# Patient Record
Sex: Female | Born: 1950 | Race: White | Hispanic: No | Marital: Single | Smoking: Former smoker
Health system: Southern US, Community
[De-identification: ages and names within clinical notes are randomized; demographics above are authoritative.]

## PROBLEM LIST (undated history)

## (undated) DIAGNOSIS — I499 Cardiac arrhythmia, unspecified: Secondary | ICD-10-CM

## (undated) DIAGNOSIS — T4145XA Adverse effect of unspecified anesthetic, initial encounter: Secondary | ICD-10-CM

## (undated) DIAGNOSIS — R112 Nausea with vomiting, unspecified: Secondary | ICD-10-CM

## (undated) DIAGNOSIS — E559 Vitamin D deficiency, unspecified: Secondary | ICD-10-CM

## (undated) DIAGNOSIS — R7303 Prediabetes: Secondary | ICD-10-CM

## (undated) DIAGNOSIS — J189 Pneumonia, unspecified organism: Secondary | ICD-10-CM

## (undated) DIAGNOSIS — M1710 Unilateral primary osteoarthritis, unspecified knee: Secondary | ICD-10-CM

## (undated) DIAGNOSIS — E785 Hyperlipidemia, unspecified: Secondary | ICD-10-CM

## (undated) DIAGNOSIS — T8859XA Other complications of anesthesia, initial encounter: Secondary | ICD-10-CM

## (undated) DIAGNOSIS — J309 Allergic rhinitis, unspecified: Secondary | ICD-10-CM

## (undated) DIAGNOSIS — R011 Cardiac murmur, unspecified: Secondary | ICD-10-CM

## (undated) DIAGNOSIS — M179 Osteoarthritis of knee, unspecified: Secondary | ICD-10-CM

## (undated) DIAGNOSIS — Z862 Personal history of diseases of the blood and blood-forming organs and certain disorders involving the immune mechanism: Secondary | ICD-10-CM

## (undated) DIAGNOSIS — G9050 Complex regional pain syndrome I, unspecified: Secondary | ICD-10-CM

## (undated) DIAGNOSIS — M1990 Unspecified osteoarthritis, unspecified site: Secondary | ICD-10-CM

## (undated) DIAGNOSIS — Z9889 Other specified postprocedural states: Secondary | ICD-10-CM

## (undated) HISTORY — PX: BILATERAL OOPHORECTOMY: SHX1221

## (undated) HISTORY — PX: WRIST SURGERY: SHX841

---
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
## 1987-06-11 HISTORY — PX: MANDIBLE RECONSTRUCTION: SHX431

## 1992-06-10 HISTORY — PX: ABDOMINAL HYSTERECTOMY: SHX81

## 1996-06-10 DIAGNOSIS — J189 Pneumonia, unspecified organism: Secondary | ICD-10-CM

## 1996-06-10 HISTORY — DX: Pneumonia, unspecified organism: J18.9

## 2014-02-23 ENCOUNTER — Other Ambulatory Visit: Payer: Self-pay

## 2014-02-23 DIAGNOSIS — Z1231 Encounter for screening mammogram for malignant neoplasm of breast: Secondary | ICD-10-CM

## 2014-03-08 ENCOUNTER — Encounter (INDEPENDENT_AMBULATORY_CARE_PROVIDER_SITE_OTHER): Payer: Self-pay

## 2014-03-08 ENCOUNTER — Ambulatory Visit
Admission: RE | Admit: 2014-03-08 | Discharge: 2014-03-08 | Disposition: A | Discharge status: Home / Self Care | Payer: TRICARE For Life (TFL) | Source: Ambulatory Visit

## 2014-03-08 DIAGNOSIS — Z1231 Encounter for screening mammogram for malignant neoplasm of breast: Secondary | ICD-10-CM

## 2015-08-18 ENCOUNTER — Emergency Department (HOSPITAL_COMMUNITY)
Admission: EM | Admit: 2015-08-18 | Discharge: 2015-08-18 | Disposition: A | Discharge status: Home / Self Care | Payer: TRICARE For Life (TFL) | Attending: Emergency Medicine | Admitting: Emergency Medicine

## 2015-08-18 ENCOUNTER — Encounter (HOSPITAL_COMMUNITY): Payer: Self-pay | Admitting: Emergency Medicine

## 2015-08-18 ENCOUNTER — Emergency Department (HOSPITAL_COMMUNITY): Payer: TRICARE For Life (TFL)

## 2015-08-18 DIAGNOSIS — S29001A Unspecified injury of muscle and tendon of front wall of thorax, initial encounter: Secondary | ICD-10-CM | POA: Diagnosis not present

## 2015-08-18 DIAGNOSIS — X58XXXA Exposure to other specified factors, initial encounter: Secondary | ICD-10-CM | POA: Diagnosis not present

## 2015-08-18 DIAGNOSIS — T148 Other injury of unspecified body region: Secondary | ICD-10-CM | POA: Diagnosis not present

## 2015-08-18 DIAGNOSIS — Y9389 Activity, other specified: Secondary | ICD-10-CM | POA: Insufficient documentation

## 2015-08-18 DIAGNOSIS — T148XXA Other injury of unspecified body region, initial encounter: Secondary | ICD-10-CM

## 2015-08-18 DIAGNOSIS — Y998 Other external cause status: Secondary | ICD-10-CM | POA: Diagnosis not present

## 2015-08-18 DIAGNOSIS — Y9289 Other specified places as the place of occurrence of the external cause: Secondary | ICD-10-CM | POA: Insufficient documentation

## 2015-08-18 DIAGNOSIS — S46811A Strain of other muscles, fascia and tendons at shoulder and upper arm level, right arm, initial encounter: Secondary | ICD-10-CM | POA: Diagnosis not present

## 2015-08-18 DIAGNOSIS — S4991XA Unspecified injury of right shoulder and upper arm, initial encounter: Secondary | ICD-10-CM | POA: Diagnosis present

## 2015-08-18 MED ORDER — METHOCARBAMOL 500 MG PO TABS: 500.0000 mg | ORAL_TABLET | Freq: Two times a day (BID) | ORAL | Status: DC

## 2015-08-18 MED ORDER — DICLOFENAC SODIUM ER 100 MG PO TB24: 100.0000 mg | ORAL_TABLET | Freq: Every day | ORAL | Status: DC

## 2015-08-18 MED ORDER — METHOCARBAMOL 500 MG PO TABS
1000.0000 mg | ORAL_TABLET | Freq: Once | ORAL | Status: AC
  Administered 2015-08-18: 1000 mg via ORAL
  Filled 2015-08-18: qty 2

## 2015-08-18 MED ORDER — KETOROLAC TROMETHAMINE 30 MG/ML IJ SOLN
30.0000 mg | Freq: Once | INTRAMUSCULAR | Status: AC
  Administered 2015-08-18: 30 mg via INTRAVENOUS
  Filled 2015-08-18: qty 1

## 2015-08-18 NOTE — ED Notes (Signed)
Pt comes to Ed via ems, right clavicle injury, hx of spinal injuries, went to get it checked out. Decrease range of motion. Pt does not know what happened. bp 130/80, hr 78, sp02 98, rr 20. 20 in left hand/ fentanyl 100 mic given. Allergy to codeine/ morphine. Pain score 7 out 10.

## 2015-08-18 NOTE — ED Provider Notes (Signed)
CSN: [id number]     Arrival date & time 08/18/15  [id number] History   First MD Initiated Contact with Patient 08/18/15 [id number]     Chief Complaint  Patient presents with  . Clavicle Injury     (Consider location/radiation/quality/duration/timing/severity/associated sxs/prior Treatment) Patient is a 65 y.o. female presenting with shoulder injury. The history is provided by the patient.  Shoulder Injury This is a new problem. The current episode started more than 2 days ago. The problem occurs constantly. The problem has been gradually worsening. Pertinent negatives include no chest pain, no headaches and no shortness of breath. Nothing aggravates the symptoms. The symptoms are relieved by position. Treatments tried: muscle ball. The treatment provided no relief.  Thinks her clavicle is protruding following a chiropractic manipulation on Tuesday  History reviewed. No pertinent past medical history. History reviewed. No pertinent past surgical history. History reviewed. No pertinent family history. Social History  Substance Use Topics  . Smoking status: Unknown If Ever Smoked  . Smokeless tobacco: None  . Alcohol Use: None   OB History    No data available     Review of Systems  Respiratory: Negative for shortness of breath.   Cardiovascular: Negative for chest pain.  Neurological: Negative for weakness, numbness and headaches.  All other systems reviewed and are negative.     Allergies  Review of patient's allergies indicates not on file.  Home Medications   Prior to Admission medications   Not on File   There were no vitals taken for this visit. Physical Exam  Constitutional: She is oriented to person, place, and time. She appears well-developed and well-nourished. No distress.  HENT:  Head: Normocephalic and atraumatic.  Mouth/Throat: Oropharynx is clear and moist.  Eyes: Conjunctivae are normal. Pupils are equal, round, and reactive to light.  Neck: Normal range of  motion. Neck supple.  Cardiovascular: Normal rate, regular rhythm and intact distal pulses.   Pulmonary/Chest: Effort normal and breath sounds normal. No respiratory distress. She has no wheezes. She has no rales. She exhibits tenderness.  Abdominal: Soft. Bowel sounds are normal. There is no tenderness. There is no rebound and no guarding.  Musculoskeletal: Normal range of motion.  Neurological: She is alert and oriented to person, place, and time. She has normal reflexes.  Skin: Skin is warm and dry.  Psychiatric: She has a normal mood and affect.    ED Course  Procedures (including critical care time) Labs Review Labs Reviewed - No data to display  Imaging Review No results found. I have personally reviewed and evaluated these images and lab results as part of my medical decision-making.   EKG Interpretation None      MDM   Final diagnoses:  None    Muscle strain: NSAIDS, heat, and muscle relaxants.  Follow up with your PMD.  No more chiropractic manipulation     Aleighya Mcanelly, MD 08/18/15 [phone]

## 2018-12-18 ENCOUNTER — Other Ambulatory Visit: Payer: Self-pay | Admitting: Orthopaedic Surgery

## 2019-01-08 ENCOUNTER — Other Ambulatory Visit: Payer: Self-pay | Admitting: Orthopaedic Surgery

## 2019-01-08 NOTE — Care Plan (Signed)
Spoke with patient prior to surgery. Will discharge to home with family and HHPT. She has all equipment except a rolling walker and this has been ordered. Referral to [facility] at home. She will transition to [facility] after MD follow up.  Patient and MD agreeable to plan.  Choice offered.    Ladell Heads, [place]

## 2019-01-14 NOTE — Patient Instructions (Signed)
YOU NEED TO HAVE A COVID 19 TEST ON__8/7_____ @_3 :20______, THIS TEST MUST BE DONE BEFORE SURGERY, COME  [address], [place] , [id number]. ONCE YOUR COVID TEST IS COMPLETED, PLEASE BEGIN THE QUARANTINE INSTRUCTIONS AS OUTLINED IN YOUR HANDOUT.                Erica Stanley    Your procedure is scheduled on: Tuesday 01/19/19   Report to [facility] Main  Entrance Report to Short Stay at 5:30 AM   1 VISITOR IS ALLOWED TO WAIT IN WAITING ROOM  ONLY DAY OF YOUR SURGERY.  No family in Short Stay at this time   Call this number if you have problems the morning of surgery [phone]    BRUSH YOUR TEETH MORNING OF SURGERY AND RINSE YOUR MOUTH OUT, NO CHEWING GUM CANDY OR MINTS.   Do not eat food After Midnight.  YOU MAY HAVE CLEAR LIQUIDS FROM MIDNIGHT UNTIL 4:00 AM . At 4:00 AM Please finish the prescribed Pre-Surgery  drink. Nothing by mouth after you finish the  drink !   Take these medicines the morning of surgery with A SIP OF WATER:none                                  You may not have any metal on your body including hair pins and              piercings              Do not wear jewelry, make-up, lotions, powders or perfumes, deodorant             Do not wear nail polish.  Do not shave  48 hours prior to surgery.          Do not bring valuables to the hospital. [facility] IS NOT             RESPONSIBLE   FOR VALUABLES.  Contacts, dentures or bridgework may not be worn into surgery.    Patients discharged the day of surgery will not be allowed to drive home.  IF YOU ARE HAVING SURGERY AND GOING HOME THE SAME DAY, YOU MUST HAVE AN ADULT TO DRIVE YOU HOME AND BE WITH YOU FOR 24 HOURS.  YOU MAY GO HOME BY TAXI OR UBER OR ORTHERWISE, BUT AN ADULT MUST ACCOMPANY YOU HOME AND STAY WITH YOU FOR 24 HOURS.  Name and phone number of your driver:  Special Instructions: N/A           _____________________________________________________________________              [facility] - Preparing for [facility]  Before surgery, you can play an important role.   Because skin is not sterile, your skin needs to be as free of germs as possible.   You can reduce the number of germs on your skin by washing with CHG (chlorahexidine gluconate) soap before surgery.   CHG is an antiseptic cleaner which kills germs and bonds with the skin to continue killing germs even after washing. Please DO NOT use if you have an allergy to CHG or antibacterial soaps.   If your skin becomes reddened/irritated stop using the CHG and inform your nurse when you arrive at Short Stay. Do not shave (including legs and underarms) for at least 48 hours prior to the first CHG shower.   Please follow these instructions carefully:  1.  Shower with CHG Soap the night before surgery and the  morning of Surgery.  2.  If you choose to wash your hair, wash your hair first as usual with your  normal  shampoo.  3.  After you shampoo, rinse your hair and body thoroughly to remove the  shampoo.                                        4.  Use CHG as you would any other liquid soap.  You can apply chg directly  to the skin and wash                       Gently with a scrungie or clean washcloth.  5.  Apply the CHG Soap to your body ONLY FROM THE NECK DOWN.   Do not use on face/ open                           Wound or open sores. Avoid contact with eyes, ears mouth and genitals (private parts).                       Wash face,  Genitals (private parts) with your normal soap.             6.  Wash thoroughly, paying special attention to the area where your surgery  will be performed.  7.  Thoroughly rinse your body with warm water from the neck down.  8.  DO NOT shower/wash with your normal soap after using and rinsing off  the CHG Soap.             9.  Pat yourself dry with a clean towel.            10.  Wear clean pajamas.            11.  Place clean sheets on your bed the night of your first shower and do not   sleep with pets.  Day of Surgery : Do not apply any lotions/deodorants the morning of surgery.  Please wear clean clothes to the hospital/surgery center.   FAILURE TO FOLLOW THESE INSTRUCTIONS MAY RESULT IN THE CANCELLATION OF YOUR SURGERY PATIENT SIGNATURE_________________________________  NURSE SIGNATURE__________________________________  ________________________________________________________________________   Rogelia MireIncentive Spirometer  An incentive spirometer is a tool that can help keep your lungs clear and active. This tool measures how well you are filling your lungs with each breath. Taking long deep breaths may help reverse or decrease the chance of developing breathing (pulmonary) problems (especially infection) following:  A long period of time when you are unable to move or be active. BEFORE THE PROCEDURE   If the spirometer includes an indicator to show your best effort, your nurse or respiratory therapist will set it to a desired goal.  If possible, sit up straight or lean slightly forward. Try not to slouch.  Hold the incentive spirometer in an upright position. INSTRUCTIONS FOR USE  1. Sit on the edge of your bed if possible, or sit up as far as you can in bed or on a chair. 2. Hold the incentive spirometer in an upright position. 3. Breathe out normally. 4. Place the mouthpiece in your mouth and seal your lips tightly around it. 5. Breathe in slowly and as deeply as possible,  raising the piston or the ball toward the top of the column. 6. Hold your breath for 3-5 seconds or for as long as possible. Allow the piston or ball to fall to the bottom of the column. 7. Remove the mouthpiece from your mouth and breathe out normally. 8. Rest for a few seconds and repeat Steps 1 through 7 at least 10 times every 1-2 hours when you are awake. Take your time and take a few normal breaths between deep breaths. 9. The spirometer may include an indicator to show your best effort. Use  the indicator as a goal to work toward during each repetition. 10. After each set of 10 deep breaths, practice coughing to be sure your lungs are clear. If you have an incision (the cut made at the time of surgery), support your incision when coughing by placing a pillow or rolled up towels firmly against it. Once you are able to get out of bed, walk around indoors and cough well. You may stop using the incentive spirometer when instructed by your caregiver.  RISKS AND COMPLICATIONS  Take your time so you do not get dizzy or light-headed.  If you are in pain, you may need to take or ask for pain medication before doing incentive spirometry. It is harder to take a deep breath if you are having pain. AFTER USE  Rest and breathe slowly and easily.  It can be helpful to keep track of a log of your progress. Your caregiver can provide you with a simple table to help with this. If you are using the spirometer at home, follow these instructions: [place] IF:   You are having difficultly using the spirometer.  You have trouble using the spirometer as often as instructed.  Your pain medication is not giving enough relief while using the spirometer.  You develop fever of 100.5 F (38.1 C) or higher. SEEK IMMEDIATE MEDICAL CARE IF:   You cough up bloody sputum that had not been present before.  You develop fever of 102 F (38.9 C) or greater.  You develop worsening pain at or near the incision site. MAKE SURE YOU:   Understand these instructions.  Will watch your condition.  Will get help right away if you are not doing well or get worse. Document Released: 10/07/2006 Document Revised: 08/19/2011 Document Reviewed: 12/08/2006 [facility] Patient Information 2014 [place], [place].   ________________________________________________________________________

## 2019-01-15 ENCOUNTER — Ambulatory Visit (HOSPITAL_COMMUNITY)
Admission: RE | Admit: 2019-01-15 | Discharge: 2019-01-15 | Disposition: A | Discharge status: Home / Self Care | Payer: Medicare Other | Source: Ambulatory Visit | Attending: Orthopaedic Surgery | Admitting: Orthopaedic Surgery

## 2019-01-15 ENCOUNTER — Other Ambulatory Visit: Payer: Self-pay

## 2019-01-15 ENCOUNTER — Other Ambulatory Visit (HOSPITAL_COMMUNITY)
Admission: RE | Admit: 2019-01-15 | Discharge: 2019-01-15 | Disposition: A | Discharge status: Home / Self Care | Payer: Medicare Other | Source: Ambulatory Visit

## 2019-01-15 ENCOUNTER — Encounter (INDEPENDENT_AMBULATORY_CARE_PROVIDER_SITE_OTHER): Payer: Self-pay

## 2019-01-15 ENCOUNTER — Encounter (HOSPITAL_COMMUNITY)
Admission: RE | Admit: 2019-01-15 | Discharge: 2019-01-15 | Disposition: A | Discharge status: Home / Self Care | Payer: Medicare Other | Source: Ambulatory Visit | Attending: Orthopaedic Surgery | Admitting: Orthopaedic Surgery

## 2019-01-15 ENCOUNTER — Encounter (HOSPITAL_COMMUNITY): Payer: Self-pay

## 2019-01-15 DIAGNOSIS — Z20828 Contact with and (suspected) exposure to other viral communicable diseases: Secondary | ICD-10-CM | POA: Diagnosis not present

## 2019-01-15 DIAGNOSIS — M1712 Unilateral primary osteoarthritis, left knee: Secondary | ICD-10-CM | POA: Diagnosis not present

## 2019-01-15 DIAGNOSIS — Z01818 Encounter for other preprocedural examination: Secondary | ICD-10-CM

## 2019-01-15 DIAGNOSIS — R9431 Abnormal electrocardiogram [ECG] [EKG]: Secondary | ICD-10-CM | POA: Diagnosis not present

## 2019-01-15 DIAGNOSIS — I447 Left bundle-branch block, unspecified: Secondary | ICD-10-CM | POA: Diagnosis not present

## 2019-01-15 HISTORY — DX: Cardiac arrhythmia, unspecified: I49.9

## 2019-01-15 HISTORY — DX: Other complications of anesthesia, initial encounter: T88.59XA

## 2019-01-15 HISTORY — DX: Nausea with vomiting, unspecified: R11.2

## 2019-01-15 HISTORY — DX: Other specified postprocedural states: Z98.890

## 2019-01-15 LAB — CBC WITH DIFFERENTIAL/PLATELET
Abs Immature Granulocytes: 0.03 10*3/uL (ref 0.00–0.07)
Basophils Absolute: 0.1 10*3/uL (ref 0.0–0.1)
Basophils Relative: 1 %
Eosinophils Absolute: 0.3 10*3/uL (ref 0.0–0.5)
Eosinophils Relative: 5 %
HCT: 40.5 % (ref 36.0–46.0)
Hemoglobin: 12.9 g/dL (ref 12.0–15.0)
Immature Granulocytes: 1 %
Lymphocytes Relative: 27 %
Lymphs Abs: 1.7 10*3/uL (ref 0.7–4.0)
MCH: 29.9 pg (ref 26.0–34.0)
MCHC: 31.9 g/dL (ref 30.0–36.0)
MCV: 93.8 fL (ref 80.0–100.0)
Monocytes Absolute: 0.5 10*3/uL (ref 0.1–1.0)
Monocytes Relative: 8 %
Neutro Abs: 3.7 10*3/uL (ref 1.7–7.7)
Neutrophils Relative %: 58 %
Platelets: 287 10*3/uL (ref 150–400)
RBC: 4.32 MIL/uL (ref 3.87–5.11)
RDW: 13.3 % (ref 11.5–15.5)
WBC: 6.2 10*3/uL (ref 4.0–10.5)
nRBC: 0 % (ref 0.0–0.2)

## 2019-01-15 LAB — BASIC METABOLIC PANEL
Anion gap: 9 (ref 5–15)
BUN: 19 mg/dL (ref 8–23)
CO2: 29 mmol/L (ref 22–32)
Calcium: 9.7 mg/dL (ref 8.9–10.3)
Chloride: 102 mmol/L (ref 98–111)
Creatinine, Ser: 0.72 mg/dL (ref 0.44–1.00)
GFR calc Af Amer: >60 mL/min (ref >60)
GFR calc non Af Amer: >60 mL/min (ref >60)
Glucose, Bld: 144 mg/dL — ABNORMAL HIGH (ref 70–99)
Potassium: 3.8 mmol/L (ref 3.5–5.1)
Sodium: 140 mmol/L (ref 135–145)

## 2019-01-15 LAB — URINALYSIS, ROUTINE W REFLEX MICROSCOPIC
Bilirubin Urine: NEGATIVE
Glucose, UA: NEGATIVE mg/dL
Hgb urine dipstick: NEGATIVE
Ketones, ur: NEGATIVE mg/dL
Leukocytes,Ua: NEGATIVE
Nitrite: NEGATIVE
Protein, ur: NEGATIVE mg/dL
Specific Gravity, Urine: 1.006 (ref 1.005–1.030)
pH: 6 (ref 5.0–8.0)

## 2019-01-15 LAB — APTT: aPTT: 31 seconds (ref 24–36)

## 2019-01-15 LAB — SURGICAL PCR SCREEN
MRSA, PCR: NEGATIVE
Staphylococcus aureus: POSITIVE — AB

## 2019-01-15 LAB — PROTIME-INR
INR: 1 (ref 0.8–1.2)
Prothrombin Time: 13 seconds (ref 11.4–15.2)

## 2019-01-16 LAB — ABO/RH: ABO/RH(D): A POS

## 2019-01-16 LAB — SARS CORONAVIRUS 2 (TAT 6-24 HRS): SARS Coronavirus 2: NEGATIVE

## 2019-01-18 MED ORDER — BUPIVACAINE LIPOSOME 1.3 % IJ SUSP
20.0000 mL | Freq: Once | INTRAMUSCULAR | Status: DC
  Filled 2019-01-18: qty 20

## 2019-01-18 MED ORDER — TRANEXAMIC ACID 1000 MG/10ML IV SOLN
2000.0000 mg | INTRAVENOUS | Status: DC
  Filled 2019-01-18: qty 20

## 2019-01-18 NOTE — H&P (Signed)
TOTAL KNEE ADMISSION H&P  Patient is being admitted for left total knee arthroplasty.  Subjective:  Chief Complaint:left knee pain.  HPI: TEPPCO PartnersCandice Stanley, 68 y.o. female, has a history of pain and functional disability in the left knee due to arthritis and has failed non-surgical conservative treatments for greater than 12 weeks to includeNSAID's and/or analgesics, corticosteriod injections, viscosupplementation injections, flexibility and strengthening excercises, use of assistive devices, weight reduction as appropriate and activity modification.  Onset of symptoms was gradual, starting 5 years ago with gradually worsening course since that time. The patient noted no past surgery on the left knee(s).  Patient currently rates pain in the left knee(s) at 10 out of 10 with activity. Patient has night pain, worsening of pain with activity and weight bearing, pain that interferes with activities of daily living, crepitus and joint swelling.  Patient has evidence of subchondral cysts, subchondral sclerosis, periarticular osteophytes and joint space narrowing by imaging studies. There is no active infection.  There are no active problems to display for this patient.  Past Medical History:  Diagnosis Date  . Complication of anesthesia   . Dysrhythmia     Lt BBB  . Pneumonia 1998   bactirial   . PONV (postoperative nausea and vomiting)     Past Surgical History:  Procedure Laterality Date  . ABDOMINAL HYSTERECTOMY  1994  . CLOSED REDUCTION TOE FRACTURE Left 1968  . MANDIBLE RECONSTRUCTION  1989  . TONSILLECTOMY  1959    Current Facility-Administered Medications  Medication Dose Route Frequency Provider Last Rate Last Dose  . [START ON 01/19/2019] bupivacaine liposome (EXPAREL) 1.3 % injection 266 mg  20 mL Other Once Marcene Corningalldorf, Peter, MD      . Melene Muller[START ON 01/19/2019] tranexamic acid (CYKLOKAPRON) 2,000 mg in sodium chloride 0.9 % 50 mL Topical Application  2,000 mg Topical To OR Marcene Corningalldorf, Peter, MD        Current Outpatient Medications  Medication Sig Dispense Refill Last Dose  . Ascorbic Acid (VITAMIN C) 1000 MG tablet Take 1,000 mg by mouth 3 (three) times daily.     . Cholecalciferol (VITAMIN D) 125 MCG (5000 UT) CAPS Take 5,000 Units by mouth daily.     Marland Kitchen. ELDERBERRY PO Take 1 capsule by mouth 2 (two) times daily.     . Homeopathic Products (SIMILASAN DRY EYE RELIEF OP) Place 1 drop into both eyes 2 (two) times daily as needed (dry eyes).     . Homeopathic Products [facility](SIMILASAN NASAL ALLERGY RELIEF NA) Place 2 sprays into both nostrils daily as needed (allergies).     . lactobacillus acidophilus (BACID) TABS tablet Take 2 tablets by mouth 3 (three) times daily.     . mometasone (ELOCON) 0.1 % cream Apply 1 application topically daily as needed (irritation).     . Multiple Minerals (CALCIUM-MAGNESIUM-ZINC) TABS Take 1 tablet by mouth 3 (three) times daily.     . Multiple Vitamin (MULTIVITAMIN WITH MINERALS) TABS tablet Take 1 tablet by mouth daily.     Marland Kitchen. PANCREATIN PO Take 350 mg by mouth 3 (three) times daily.     . Potassium 99 MG TABS Take 99 mg by mouth 3 (three) times daily.     . TURMERIC PO Take 900 mg by mouth 3 (three) times daily.     . Diclofenac Sodium CR (VOLTAREN-XR) 100 MG 24 hr tablet Take 1 tablet (100 mg total) by mouth daily. (Patient not taking: Reported on 01/14/2019) 10 tablet 0 Not Taking at Unknown time  .  methocarbamol (ROBAXIN) 500 MG tablet Take 1 tablet (500 mg total) by mouth 2 (two) times daily. (Patient not taking: Reported on 01/14/2019) 20 tablet 0 Not Taking at Unknown time   Allergies  Allergen Reactions  . Codeine Nausea And Vomiting  . Hydrocortisone Hives  . Menthol Hives, Itching, Nausea And Vomiting and Swelling  . Morphine And Related Nausea And Vomiting    Social History   Tobacco Use  . Smoking status: Former Smoker    Packs/day: 0.25    Years: 5.00    Pack years: 1.25    Types: Cigarettes    Quit date: 1984    Years since quitting: 36.6   . Smokeless tobacco: Never Used  Substance Use Topics  . Alcohol use: Yes    Comment: occational    No family history on file.   Review of Systems  Musculoskeletal: Positive for joint pain.       Left knee  All other systems reviewed and are negative.   Objective:  Physical Exam  Constitutional: She is oriented to person, place, and time. She appears well-developed and well-nourished.  HENT:  Head: Normocephalic and atraumatic.  Eyes: Pupils are equal, round, and reactive to light. Conjunctivae are normal.  Neck: Normal range of motion.  Cardiovascular: Normal rate and regular rhythm.  Respiratory: Effort normal.  GI: Soft.  Musculoskeletal:     Comments: Left knee exhibits a mild valgus deformity.  Her motion remains 0-120.  She has patellofemoral and lateral pain.  There is crepitation and 1+ effusion today.  Hip motion is full and straight leg raise is negative.  Sensation and motor function are intact in her feet with palpable pulses on both sides.    Neurological: She is alert and oriented to person, place, and time.  Skin: Skin is warm and dry.  Psychiatric: She has a normal mood and affect. Her behavior is normal. Judgment and thought content normal.    Vital signs in last 24 hours:    Labs:   Estimated body mass index is 32.12 kg/m as calculated from the following:   Height as of 01/15/19: 5\' 6"  (1.676 m).   Weight as of 01/15/19: 90.3 kg.   Imaging Review Plain radiographs demonstrate severe degenerative joint disease of the left knee(s). The overall alignment isneutral. The bone quality appears to be good for age and reported activity level.      Assessment/Plan:  End stage primary arthritis, left knee   The patient history, physical examination, clinical judgment of the provider and imaging studies are consistent with end stage degenerative joint disease of the left knee(s) and total knee arthroplasty is deemed medically necessary. The treatment options  including medical management, injection therapy arthroscopy and arthroplasty were discussed at length. The risks and benefits of total knee arthroplasty were presented and reviewed. The risks due to aseptic loosening, infection, stiffness, patella tracking problems, thromboembolic complications and other imponderables were discussed. The patient acknowledged the explanation, agreed to proceed with the plan and consent was signed. Patient is being admitted for inpatient treatment for surgery, pain control, PT, OT, prophylactic antibiotics, VTE prophylaxis, progressive ambulation and ADL's and discharge planning. The patient is planning to be discharged home with home health services     Patient's anticipated LOS is less than 2 midnights, meeting these requirements: - Younger than 62 - Lives within 1 hour of care - Has a competent adult at home to recover with post-op recover - NO history of  - Chronic pain requiring  opiods  - Diabetes  - Coronary Artery Disease  - Heart failure  - Heart attack  - Stroke  - DVT/VTE  - Cardiac arrhythmia  - Respiratory Failure/COPD  - Renal failure  - Anemia  - Advanced Liver disease

## 2019-01-19 ENCOUNTER — Ambulatory Visit (HOSPITAL_COMMUNITY): Payer: Medicare Other | Admitting: Certified Registered Nurse Anesthetist

## 2019-01-19 ENCOUNTER — Encounter (HOSPITAL_COMMUNITY)
Admission: RE | Disposition: A | Discharge status: Home / Self Care | Payer: Self-pay | Source: Other Acute Inpatient Hospital | Attending: Orthopaedic Surgery

## 2019-01-19 ENCOUNTER — Observation Stay (HOSPITAL_COMMUNITY)
Admission: RE | Admit: 2019-01-19 | Discharge: 2019-01-20 | Disposition: A | Discharge status: Home / Self Care | Payer: Medicare Other | Source: Other Acute Inpatient Hospital | Attending: Orthopaedic Surgery | Admitting: Orthopaedic Surgery

## 2019-01-19 ENCOUNTER — Other Ambulatory Visit: Payer: Self-pay

## 2019-01-19 ENCOUNTER — Encounter (HOSPITAL_COMMUNITY): Payer: Self-pay

## 2019-01-19 ENCOUNTER — Ambulatory Visit (HOSPITAL_COMMUNITY): Payer: Medicare Other | Admitting: Physician Assistant

## 2019-01-19 DIAGNOSIS — R2689 Other abnormalities of gait and mobility: Secondary | ICD-10-CM | POA: Insufficient documentation

## 2019-01-19 DIAGNOSIS — R262 Difficulty in walking, not elsewhere classified: Secondary | ICD-10-CM | POA: Diagnosis not present

## 2019-01-19 DIAGNOSIS — Z885 Allergy status to narcotic agent status: Secondary | ICD-10-CM | POA: Diagnosis not present

## 2019-01-19 DIAGNOSIS — Z87891 Personal history of nicotine dependence: Secondary | ICD-10-CM | POA: Insufficient documentation

## 2019-01-19 DIAGNOSIS — Z888 Allergy status to other drugs, medicaments and biological substances status: Secondary | ICD-10-CM | POA: Diagnosis not present

## 2019-01-19 DIAGNOSIS — Z90710 Acquired absence of both cervix and uterus: Secondary | ICD-10-CM | POA: Diagnosis not present

## 2019-01-19 DIAGNOSIS — Z79899 Other long term (current) drug therapy: Secondary | ICD-10-CM | POA: Diagnosis not present

## 2019-01-19 DIAGNOSIS — M1712 Unilateral primary osteoarthritis, left knee: Principal | ICD-10-CM | POA: Insufficient documentation

## 2019-01-19 HISTORY — PX: TOTAL KNEE ARTHROPLASTY: SHX125

## 2019-01-19 LAB — TYPE AND SCREEN
ABO/RH(D): A POS
Antibody Screen: NEGATIVE

## 2019-01-19 SURGERY — ARTHROPLASTY, KNEE, TOTAL
Anesthesia: Spinal | Site: Knee | Laterality: Left

## 2019-01-19 MED ORDER — HYDROCODONE-ACETAMINOPHEN 7.5-325 MG PO TABS: 1.0000 | ORAL_TABLET | ORAL | Status: DC | PRN

## 2019-01-19 MED ORDER — METHOCARBAMOL 500 MG PO TABS: 500.0000 mg | ORAL_TABLET | Freq: Four times a day (QID) | ORAL | Status: DC | PRN

## 2019-01-19 MED ORDER — PHENYLEPHRINE 40 MCG/ML (10ML) SYRINGE FOR IV PUSH (FOR BLOOD PRESSURE SUPPORT)
PREFILLED_SYRINGE | INTRAVENOUS | Status: DC | PRN
  Administered 2019-01-19 (×2): 80 ug via INTRAVENOUS

## 2019-01-19 MED ORDER — PROPOFOL 10 MG/ML IV BOLUS
INTRAVENOUS | Status: DC | PRN
  Administered 2019-01-19: 20 mg via INTRAVENOUS
  Administered 2019-01-19: 10 mg via INTRAVENOUS

## 2019-01-19 MED ORDER — SODIUM CHLORIDE 0.9 % IR SOLN
Status: DC | PRN
  Administered 2019-01-19: 2000 mL

## 2019-01-19 MED ORDER — METOCLOPRAMIDE HCL 5 MG/ML IJ SOLN: 5.0000 mg | Freq: Three times a day (TID) | INTRAMUSCULAR | Status: DC | PRN

## 2019-01-19 MED ORDER — TRANEXAMIC ACID-NACL 1000-0.7 MG/100ML-% IV SOLN
1000.0000 mg | Freq: Once | INTRAVENOUS | Status: AC
  Administered 2019-01-19: 1000 mg via INTRAVENOUS
  Filled 2019-01-19: qty 100

## 2019-01-19 MED ORDER — CLONIDINE HCL (ANALGESIA) 100 MCG/ML EP SOLN
EPIDURAL | Status: DC | PRN
  Administered 2019-01-19: 70 ug

## 2019-01-19 MED ORDER — FENTANYL CITRATE (PF) 100 MCG/2ML IJ SOLN
INTRAMUSCULAR | Status: DC | PRN
  Administered 2019-01-19 (×2): 50 ug via INTRAVENOUS

## 2019-01-19 MED ORDER — SODIUM CHLORIDE (PF) 0.9 % IJ SOLN
INTRAMUSCULAR | Status: DC | PRN
  Administered 2019-01-19: 30 mL

## 2019-01-19 MED ORDER — POVIDONE-IODINE 10 % EX SWAB
2.0000 "application " | Freq: Once | CUTANEOUS | Status: AC
  Administered 2019-01-19: 2 via TOPICAL

## 2019-01-19 MED ORDER — CHLORHEXIDINE GLUCONATE 4 % EX LIQD: 60.0000 mL | Freq: Once | CUTANEOUS | Status: DC

## 2019-01-19 MED ORDER — BUPIVACAINE HCL (PF) 0.5 % IJ SOLN
INTRAMUSCULAR | Status: DC | PRN
  Administered 2019-01-19: 20 mL via PERINEURAL

## 2019-01-19 MED ORDER — PROMETHAZINE HCL 25 MG/ML IJ SOLN
6.2500 mg | INTRAMUSCULAR | Status: DC | PRN
  Administered 2019-01-19: 6.25 mg via INTRAVENOUS

## 2019-01-19 MED ORDER — HYDROMORPHONE HCL 1 MG/ML IJ SOLN
0.5000 mg | INTRAMUSCULAR | Status: DC | PRN
  Administered 2019-01-19: 1 mg via INTRAVENOUS
  Filled 2019-01-19: qty 1

## 2019-01-19 MED ORDER — LACTATED RINGERS IV SOLN
INTRAVENOUS | Status: DC
  Administered 2019-01-19: 22:00:00 via INTRAVENOUS

## 2019-01-19 MED ORDER — ACETAMINOPHEN 325 MG PO TABS: 325.0000 mg | ORAL_TABLET | Freq: Four times a day (QID) | ORAL | Status: DC | PRN

## 2019-01-19 MED ORDER — CEFAZOLIN SODIUM-DEXTROSE 2-4 GM/100ML-% IV SOLN
2.0000 g | INTRAVENOUS | Status: AC
  Administered 2019-01-19: 2 g via INTRAVENOUS
  Filled 2019-01-19: qty 100

## 2019-01-19 MED ORDER — LIDOCAINE 2% (20 MG/ML) 5 ML SYRINGE
INTRAMUSCULAR | Status: AC
  Filled 2019-01-19: qty 5

## 2019-01-19 MED ORDER — BUPIVACAINE-EPINEPHRINE (PF) 0.25% -1:200000 IJ SOLN
INTRAMUSCULAR | Status: AC
  Filled 2019-01-19: qty 30

## 2019-01-19 MED ORDER — DEXAMETHASONE SODIUM PHOSPHATE 10 MG/ML IJ SOLN
INTRAMUSCULAR | Status: DC | PRN
  Administered 2019-01-19: 10 mg via INTRAVENOUS

## 2019-01-19 MED ORDER — ALUM & MAG HYDROXIDE-SIMETH 200-200-20 MG/5ML PO SUSP: 30.0000 mL | ORAL | Status: DC | PRN

## 2019-01-19 MED ORDER — TRANEXAMIC ACID-NACL 1000-0.7 MG/100ML-% IV SOLN
1000.0000 mg | INTRAVENOUS | Status: AC
  Administered 2019-01-19: 08:00:00 1000 mg via INTRAVENOUS
  Filled 2019-01-19: qty 100

## 2019-01-19 MED ORDER — DOCUSATE SODIUM 100 MG PO CAPS
100.0000 mg | ORAL_CAPSULE | Freq: Two times a day (BID) | ORAL | Status: DC
  Administered 2019-01-19 – 2019-01-20 (×2): 100 mg via ORAL
  Filled 2019-01-19 (×2): qty 1

## 2019-01-19 MED ORDER — DIPHENHYDRAMINE HCL 12.5 MG/5ML PO ELIX: 12.5000 mg | ORAL_SOLUTION | ORAL | Status: DC | PRN

## 2019-01-19 MED ORDER — TRANEXAMIC ACID 1000 MG/10ML IV SOLN
INTRAVENOUS | Status: DC | PRN
  Administered 2019-01-19: 2000 mg via TOPICAL

## 2019-01-19 MED ORDER — CEFAZOLIN SODIUM-DEXTROSE 2-4 GM/100ML-% IV SOLN
2.0000 g | Freq: Four times a day (QID) | INTRAVENOUS | Status: AC
  Administered 2019-01-19 (×2): 2 g via INTRAVENOUS
  Filled 2019-01-19 (×2): qty 100

## 2019-01-19 MED ORDER — MIDAZOLAM HCL 2 MG/2ML IJ SOLN
INTRAMUSCULAR | Status: AC
  Filled 2019-01-19: qty 2

## 2019-01-19 MED ORDER — KETOROLAC TROMETHAMINE 15 MG/ML IJ SOLN
7.5000 mg | Freq: Four times a day (QID) | INTRAMUSCULAR | Status: AC
  Administered 2019-01-19 – 2019-01-20 (×4): 7.5 mg via INTRAVENOUS
  Filled 2019-01-19 (×3): qty 1

## 2019-01-19 MED ORDER — DEXAMETHASONE SODIUM PHOSPHATE 10 MG/ML IJ SOLN
INTRAMUSCULAR | Status: AC
  Filled 2019-01-19: qty 1

## 2019-01-19 MED ORDER — HYDROMORPHONE HCL 1 MG/ML IJ SOLN
INTRAMUSCULAR | Status: AC
  Filled 2019-01-19: qty 1

## 2019-01-19 MED ORDER — ONDANSETRON HCL 4 MG/2ML IJ SOLN: 4.0000 mg | Freq: Four times a day (QID) | INTRAMUSCULAR | Status: DC | PRN

## 2019-01-19 MED ORDER — PHENYLEPHRINE HCL-NACL 10-0.9 MG/250ML-% IV SOLN
INTRAVENOUS | Status: AC
  Filled 2019-01-19: qty 250

## 2019-01-19 MED ORDER — BUPIVACAINE IN DEXTROSE 0.75-8.25 % IT SOLN
INTRATHECAL | Status: DC | PRN
  Administered 2019-01-19: 1.6 mL via INTRATHECAL

## 2019-01-19 MED ORDER — METHOCARBAMOL 500 MG IVPB - SIMPLE MED
INTRAVENOUS | Status: AC
  Filled 2019-01-19: qty 50

## 2019-01-19 MED ORDER — BUPIVACAINE-EPINEPHRINE (PF) 0.25% -1:200000 IJ SOLN
INTRAMUSCULAR | Status: DC | PRN
  Administered 2019-01-19: 30 mL

## 2019-01-19 MED ORDER — FENTANYL CITRATE (PF) 100 MCG/2ML IJ SOLN
INTRAMUSCULAR | Status: AC
  Filled 2019-01-19: qty 2

## 2019-01-19 MED ORDER — KETOROLAC TROMETHAMINE 15 MG/ML IJ SOLN
INTRAMUSCULAR | Status: AC
  Filled 2019-01-19: qty 1

## 2019-01-19 MED ORDER — LIDOCAINE HCL (CARDIAC) PF 100 MG/5ML IV SOSY
PREFILLED_SYRINGE | INTRAVENOUS | Status: DC | PRN
  Administered 2019-01-19: 80 mg via INTRAVENOUS

## 2019-01-19 MED ORDER — METHOCARBAMOL 500 MG IVPB - SIMPLE MED
500.0000 mg | Freq: Four times a day (QID) | INTRAVENOUS | Status: DC | PRN
  Administered 2019-01-19: 500 mg via INTRAVENOUS
  Filled 2019-01-19: qty 50

## 2019-01-19 MED ORDER — ONDANSETRON HCL 4 MG/2ML IJ SOLN
INTRAMUSCULAR | Status: AC
  Filled 2019-01-19: qty 2

## 2019-01-19 MED ORDER — ONDANSETRON HCL 4 MG/2ML IJ SOLN
INTRAMUSCULAR | Status: DC | PRN
  Administered 2019-01-19: 4 mg via INTRAVENOUS

## 2019-01-19 MED ORDER — PROPOFOL 500 MG/50ML IV EMUL
INTRAVENOUS | Status: DC | PRN
  Administered 2019-01-19: 50 ug/kg/min via INTRAVENOUS

## 2019-01-19 MED ORDER — BISACODYL 5 MG PO TBEC: 5.0000 mg | DELAYED_RELEASE_TABLET | Freq: Every day | ORAL | Status: DC | PRN

## 2019-01-19 MED ORDER — PROMETHAZINE HCL 25 MG/ML IJ SOLN
INTRAMUSCULAR | Status: AC
  Filled 2019-01-19: qty 1

## 2019-01-19 MED ORDER — SCOPOLAMINE 1 MG/3DAYS TD PT72
MEDICATED_PATCH | TRANSDERMAL | Status: DC | PRN
  Administered 2019-01-19: 1 via TRANSDERMAL

## 2019-01-19 MED ORDER — HYDROMORPHONE HCL 1 MG/ML IJ SOLN
0.2500 mg | INTRAMUSCULAR | Status: DC | PRN
  Administered 2019-01-19 (×4): 0.5 mg via INTRAVENOUS

## 2019-01-19 MED ORDER — LACTATED RINGERS IV SOLN
INTRAVENOUS | Status: DC
  Administered 2019-01-19 (×2): via INTRAVENOUS

## 2019-01-19 MED ORDER — SODIUM CHLORIDE (PF) 0.9 % IJ SOLN
INTRAMUSCULAR | Status: AC
  Filled 2019-01-19: qty 50

## 2019-01-19 MED ORDER — HYDROCODONE-ACETAMINOPHEN 5-325 MG PO TABS
1.0000 | ORAL_TABLET | ORAL | Status: DC | PRN
  Administered 2019-01-19: 2 via ORAL
  Administered 2019-01-20: 1 via ORAL
  Filled 2019-01-19: qty 1
  Filled 2019-01-19: qty 2

## 2019-01-19 MED ORDER — ONDANSETRON HCL 4 MG PO TABS: 4.0000 mg | ORAL_TABLET | Freq: Four times a day (QID) | ORAL | Status: DC | PRN

## 2019-01-19 MED ORDER — MIDAZOLAM HCL 5 MG/5ML IJ SOLN
INTRAMUSCULAR | Status: DC | PRN
  Administered 2019-01-19: 2 mg via INTRAVENOUS

## 2019-01-19 MED ORDER — ACETAMINOPHEN 500 MG PO TABS
500.0000 mg | ORAL_TABLET | Freq: Four times a day (QID) | ORAL | Status: AC
  Administered 2019-01-19 – 2019-01-20 (×4): 500 mg via ORAL
  Filled 2019-01-19 (×4): qty 1

## 2019-01-19 MED ORDER — SODIUM CHLORIDE 0.9 % IR SOLN
Status: DC | PRN
  Administered 2019-01-19: 1000 mL

## 2019-01-19 MED ORDER — METOCLOPRAMIDE HCL 5 MG PO TABS: 5.0000 mg | ORAL_TABLET | Freq: Three times a day (TID) | ORAL | Status: DC | PRN

## 2019-01-19 MED ORDER — SCOPOLAMINE 1 MG/3DAYS TD PT72
MEDICATED_PATCH | TRANSDERMAL | Status: AC
  Filled 2019-01-19: qty 1

## 2019-01-19 MED ORDER — ASPIRIN 81 MG PO CHEW
81.0000 mg | CHEWABLE_TABLET | Freq: Two times a day (BID) | ORAL | Status: DC
  Administered 2019-01-19 – 2019-01-20 (×2): 81 mg via ORAL
  Filled 2019-01-19 (×2): qty 1

## 2019-01-19 MED ORDER — BUPIVACAINE LIPOSOME 1.3 % IJ SUSP
INTRAMUSCULAR | Status: DC | PRN
  Administered 2019-01-19: 20 mL

## 2019-01-19 SURGICAL SUPPLY — 48 items
ATTUNE MED DOME PAT 38 KNEE (Knees) ×2 IMPLANT
ATTUNE PS FEM LT SZ 5 CEM KNEE (Femur) ×2 IMPLANT
ATTUNE PSRP INSR SZ5 8 KNEE (Insert) ×2 IMPLANT
BAG DECANTER FOR FLEXI CONT (MISCELLANEOUS) ×2 IMPLANT
BAG ZIPLOCK 12X15 (MISCELLANEOUS) IMPLANT
BASE TIBIA ATTUNE KNEE SYS SZ6 (Knees) ×1 IMPLANT
BLADE SAGITTAL 25.0X1.19X90 (BLADE) ×2 IMPLANT
BLADE SAW SGTL 13.0X1.19X90.0M (BLADE) ×2 IMPLANT
BNDG ELASTIC 6X5.8 VLCR STR LF (GAUZE/BANDAGES/DRESSINGS) ×2 IMPLANT
BOOTIES KNEE HIGH SLOAN (MISCELLANEOUS) IMPLANT
BOWL SMART MIX CTS (DISPOSABLE) ×2 IMPLANT
CEMENT HV SMART SET (Cement) ×4 IMPLANT
COVER SURGICAL LIGHT HANDLE (MISCELLANEOUS) ×2 IMPLANT
COVER WAND RF STERILE (DRAPES) IMPLANT
CUFF TOURN SGL QUICK 34 (TOURNIQUET CUFF) ×1
CUFF TRNQT CYL 34X4.125X (TOURNIQUET CUFF) ×1 IMPLANT
DECANTER SPIKE VIAL GLASS SM (MISCELLANEOUS) ×4 IMPLANT
DRAPE SHEET LG 3/4 BI-LAMINATE (DRAPES) ×2 IMPLANT
DRAPE TOP 10253 STERILE (DRAPES) ×2 IMPLANT
DRAPE U-SHAPE 47X51 STRL (DRAPES) ×2 IMPLANT
DRSG AQUACEL AG ADV 3.5X10 (GAUZE/BANDAGES/DRESSINGS) ×2 IMPLANT
DURAPREP 26ML APPLICATOR (WOUND CARE) ×4 IMPLANT
ELECT REM PT RETURN 15FT ADLT (MISCELLANEOUS) ×2 IMPLANT
GLOVE BIO SURGEON STRL SZ8 (GLOVE) ×4 IMPLANT
GLOVE BIOGEL PI IND STRL 8 (GLOVE) ×2 IMPLANT
GLOVE BIOGEL PI INDICATOR 8 (GLOVE) ×2
GOWN STRL REUS W/TWL XL LVL3 (GOWN DISPOSABLE) ×4 IMPLANT
HANDPIECE INTERPULSE COAX TIP (DISPOSABLE) ×1
HOLDER FOLEY CATH W/STRAP (MISCELLANEOUS) ×2 IMPLANT
HOOD PEEL AWAY FLYTE STAYCOOL (MISCELLANEOUS) ×6 IMPLANT
KIT TURNOVER KIT A (KITS) IMPLANT
MANIFOLD NEPTUNE II (INSTRUMENTS) ×2 IMPLANT
PACK TOTAL KNEE CUSTOM (KITS) ×2 IMPLANT
PAD ARMBOARD 7.5X6 YLW CONV (MISCELLANEOUS) ×2 IMPLANT
PIN DRILL FIX HALF THREAD (BIT) ×2 IMPLANT
PIN STEINMAN FIXATION KNEE (PIN) ×2 IMPLANT
PROTECTOR NERVE ULNAR (MISCELLANEOUS) ×2 IMPLANT
SET HNDPC FAN SPRY TIP SCT (DISPOSABLE) ×1 IMPLANT
SUT ETHIBOND NAB CT1 #1 30IN (SUTURE) ×4 IMPLANT
SUT VIC AB 0 CT1 36 (SUTURE) ×2 IMPLANT
SUT VIC AB 2-0 CT1 27 (SUTURE) ×1
SUT VIC AB 2-0 CT1 TAPERPNT 27 (SUTURE) ×1 IMPLANT
SUT VIC AB 3-0 CT1 27 (SUTURE) ×1
SUT VIC AB 3-0 CT1 TAPERPNT 27 (SUTURE) ×1 IMPLANT
TIBIA ATTUNE KNEE SYS BASE SZ6 (Knees) ×2 IMPLANT
TRAY FOLEY MTR SLVR 14FR STAT (SET/KITS/TRAYS/PACK) ×2 IMPLANT
WATER STERILE IRR 1000ML POUR (IV SOLUTION) ×4 IMPLANT
WRAP KNEE MAXI GEL POST OP (GAUZE/BANDAGES/DRESSINGS) ×2 IMPLANT

## 2019-01-19 NOTE — Evaluation (Addendum)
Physical Therapy Evaluation Patient Details Name: Erica BearsCandice Stanley MRN: [id number] DOB: 11/17/1950 Today's Date: 01/19/2019   History of Present Illness  68 yo female s/p L TKR on 01/19/19. PMH includes LBBB, PNA.  Clinical Impression  Pt presents with moderate L knee pain, decreased L knee ROM, difficulty performing mobility tasks, and decreased activity tolerance. Pt to benefit from acute PT to address deficits. Pt ambulated 40 ft with RW with min guard assist, verbal cuing for form and safety. Pt educated on ankle pumps (20/hour) to perform this afternoon/evening to increase circulation, to pt's tolerance and limited by pain. Pt to d/c home with friend. PT to progress mobility as tolerated, and will continue to follow acutely.        Follow Up Recommendations Follow surgeon's recommendation for DC plan and follow-up therapies;Supervision for mobility/OOB(HHPT --> OPPT)    Equipment Recommendations  Rolling walker with 5" wheels;3in1 (PT)    Recommendations for Other Services       Precautions / Restrictions Precautions Precautions: Fall Restrictions Weight Bearing Restrictions: No Other Position/Activity Restrictions: WBAT      Mobility  Bed Mobility Overal bed mobility: Needs Assistance Bed Mobility: Supine to Sit     Supine to sit: Min assist;HOB elevated     General bed mobility comments: Min assist for LLE lifting and translation to EOB, increased time and effort.  Transfers Overall transfer level: Needs assistance Equipment used: Rolling walker (2 wheeled) Transfers: Sit to/from Stand Sit to Stand: Mod assist;+2 safety/equipment;+2 physical assistance;From elevated surface         General transfer comment: Mod assist +2 for power up, hip extension, and steadying. Verbal cuing for hand placement when rising.  Ambulation/Gait Ambulation/Gait assistance: Min guard;Min assist;+2 safety/equipment Gait Distance (Feet): 40 Feet Assistive device: Rolling walker (2  wheeled) Gait Pattern/deviations: Step-to pattern;Decreased step length - right;Decreased step length - left;Trunk flexed Gait velocity: decr   General Gait Details: Min assist for L knee guarding initially, transitioning to min guard for safety. Verbal cuing for sequencing, placement in RW, turning with RW.  Stairs            Wheelchair Mobility    Modified Rankin (Stroke Patients Only)       Balance Overall balance assessment: Mild deficits observed, not formally tested                                           Pertinent Vitals/Pain Pain Assessment: 0-10 Pain Score: 4  Pain Location: L knee Pain Descriptors / Indicators: Sore Pain Intervention(s): Limited activity within patient's tolerance;Monitored during session;Premedicated before session;Repositioned;Ice applied    Home Living Family/patient expects to be discharged to:: Private residence Living Arrangements: Non-relatives/Friends Available Help at Discharge: Friend(s);Available 24 hours/day Type of Home: Apartment Home Access: Level entry     Home Layout: One level Home Equipment: Toilet riser;Cane - single point      Prior Function Level of Independence: Independent               Hand Dominance   Dominant Hand: Right(ambidextrous)    Extremity/Trunk Assessment   Upper Extremity Assessment Upper Extremity Assessment: Overall WFL for tasks assessed    Lower Extremity Assessment Lower Extremity Assessment: Overall WFL for tasks assessed;LLE deficits/detail LLE Deficits / Details: suspected post-surgical weakness; able to perform ankle pumps, quad set, heel slide to 40* limited by pain, SLR with mod lift assist  LLE Sensation: WNL    Cervical / Trunk Assessment Cervical / Trunk Assessment: Normal  Communication   Communication: No difficulties  Cognition Arousal/Alertness: Awake/alert Behavior During Therapy: WFL for tasks assessed/performed Overall Cognitive Status:  Within Functional Limits for tasks assessed                                        General Comments      Exercises     Assessment/Plan    PT Assessment Patient needs continued PT services  PT Problem List Decreased strength;Decreased mobility;Decreased range of motion;Decreased activity tolerance;Decreased balance;Decreased knowledge of use of DME;Pain       PT Treatment Interventions DME instruction;Therapeutic activities;Gait training;Therapeutic exercise;Patient/family education;Balance training;Stair training;Functional mobility training    PT Goals (Current goals can be found in the Care Plan section)  Acute Rehab PT Goals Patient Stated Goal: go home PT Goal Formulation: With patient Time For Goal Achievement: 01/26/19 Potential to Achieve Goals: Good    Frequency 7X/week   Barriers to discharge        Co-evaluation               AM-PAC PT "6 Clicks" Mobility  Outcome Measure Help needed turning from your back to your side while in a flat bed without using bedrails?: A Little Help needed moving from lying on your back to sitting on the side of a flat bed without using bedrails?: A Little Help needed moving to and from a bed to a chair (including a wheelchair)?: A Lot Help needed standing up from a chair using your arms (e.g., wheelchair or bedside chair)?: A Lot Help needed to walk in hospital room?: A Little Help needed climbing 3-5 steps with a railing? : A Lot 6 Click Score: 15    End of Session Equipment Utilized During Treatment: Gait belt Activity Tolerance: Patient limited by pain Patient left: in chair;with call bell/phone within reach;with chair alarm set;with SCD's reapplied Nurse Communication: Mobility status PT Visit Diagnosis: Other abnormalities of gait and mobility (R26.89);Difficulty in walking, not elsewhere classified (R26.2)    Time: 8250-5397 PT Time Calculation (min) (ACUTE ONLY): 27 min   Charges:   PT  Evaluation $PT Eval Low Complexity: 1 Low PT Treatments $Gait Training: 8-22 mins       Julien Girt, PT Acute Rehabilitation Services Pager [phone]  Office [phone]  Erica Stanley 01/19/2019, 6:12 PM

## 2019-01-19 NOTE — Interval H&P Note (Signed)
History and Physical Interval Note:  01/19/2019 7:23 AM  Erica Stanley  has presented today for surgery, with the diagnosis of Left Knee Degenerative Joint Disease.  The various methods of treatment have been discussed with the patient and family. After consideration of risks, benefits and other options for treatment, the patient has consented to  Procedure(s): Left Knee Arthroplasty (Left) as a surgical intervention.  The patient's history has been reviewed, patient examined, no change in status, stable for surgery.  I have reviewed the patient's chart and labs.  Questions were answered to the patient's satisfaction.     [name]

## 2019-01-19 NOTE — Care Plan (Signed)
Ortho Bundle Case Management Note  Patient Details  Name: Erica Stanley MRN: [id number] Date of Birth: [birth year]  Spoke with patient prior to surgery. Will discharge to home with family and HHPT. She has all equipment except a rolling walker and this has been ordered. Referral to [facility]. She will transition to [facility] after MD follow up.  Patient and MD agreeable to plan.  Choice offered.                     DME Arranged:  [place] rolling DME Agency:  [employer]  HH Arranged:  PT [place] Agency:  [facility] ([facility]  Additional Comments: Please contact me with any questions of if this plan should need to change.  Ladell Heads,  [facility]  [phone] 01/19/2019, 10:05 AM

## 2019-01-19 NOTE — Anesthesia Preprocedure Evaluation (Signed)
Anesthesia Evaluation  Patient identified by MRN, date of birth, ID band Patient awake    Reviewed: Allergy & Precautions, NPO status , Patient's Chart, lab work & pertinent test results  Airway Mallampati: II  TM Distance: >3 FB Neck ROM: Full    Dental no notable dental hx.    Pulmonary neg pulmonary ROS, former smoker,    Pulmonary exam normal breath sounds clear to auscultation       Cardiovascular negative cardio ROS Normal cardiovascular exam Rhythm:Regular Rate:Normal     Neuro/Psych negative neurological ROS  negative psych ROS   GI/Hepatic negative GI ROS, Neg liver ROS,   Endo/Other  negative endocrine ROS  Renal/GU negative Renal ROS  negative genitourinary   Musculoskeletal  (+) Arthritis , Osteoarthritis,    Abdominal   Peds negative pediatric ROS (+)  Hematology negative hematology ROS (+)   Anesthesia Other Findings   Reproductive/Obstetrics negative OB ROS                             Anesthesia Physical Anesthesia Plan  ASA: II  Anesthesia Plan: Spinal   Post-op Pain Management:    Induction: Intravenous  PONV Risk Score and Plan: 2 and Ondansetron, Dexamethasone and Treatment may vary due to age or medical condition  Airway Management Planned: Simple Face Mask  Additional Equipment:   Intra-op Plan:   Post-operative Plan:   Informed Consent: I have reviewed the patients History and Physical, chart, labs and discussed the procedure including the risks, benefits and alternatives for the proposed anesthesia with the patient or authorized representative who has indicated his/her understanding and acceptance.     Dental advisory given  Plan Discussed with: CRNA and Surgeon  Anesthesia Plan Comments:         Anesthesia Quick Evaluation

## 2019-01-19 NOTE — Anesthesia Postprocedure Evaluation (Signed)
Anesthesia Post Note  Patient: Erica Stanley  Procedure(s) Performed: Left Total Knee Arthroplasty (Left Knee)     Patient location during evaluation: PACU Anesthesia Type: Spinal Level of consciousness: sedated Pain management: pain level controlled Vital Signs Assessment: post-procedure vital signs reviewed and stable Respiratory status: spontaneous breathing Cardiovascular status: stable Postop Assessment: no headache, no backache, spinal receding and patient able to bend at knees Anesthetic complications: yes Anesthetic complication details: PONV   Last Vitals:  Vitals:   01/19/19 1045 01/19/19 1100  BP: (!) 154/83   Pulse: (!) 57 66  Resp: 15 20  Temp: 36.6 C   SpO2: 100% 100%    Last Pain:  Vitals:   01/19/19 1100  TempSrc:   PainSc: 0-No pain   Pain Goal:                   Huston Foley

## 2019-01-19 NOTE — Anesthesia Procedure Notes (Signed)
Anesthesia Regional Block: Adductor canal block   Pre-Anesthetic Checklist: ,, timeout performed, Correct Patient, Correct Site, Correct Laterality, Correct Procedure, Correct Position, site marked, Risks and benefits discussed,  Surgical consent,  Pre-op evaluation,  At surgeon's request and post-op pain management  Laterality: Left  Prep: chloraprep       Needles:  Injection technique: Single-shot  Needle Type: Echogenic Needle     Needle Length: 9cm      Additional Needles:   Procedures:,,,, ultrasound used (permanent image in chart),,,,  Narrative:  Start time: 01/19/2019 7:04 AM End time: 01/19/2019 7:12 AM Injection made incrementally with aspirations every 5 mL.  Performed by: Personally  Anesthesiologist: Myrtie Soman, MD  Additional Notes: Patient tolerated the procedure well without complications

## 2019-01-19 NOTE — Anesthesia Procedure Notes (Signed)
Anesthesia Procedure Image    

## 2019-01-19 NOTE — Transfer of Care (Signed)
Immediate Anesthesia Transfer of Care Note  Patient: Erica Stanley  Procedure(s) Performed: Left Total Knee Arthroplasty (Left Knee)  Patient Location: PACU  Anesthesia Type:Spinal  Level of Consciousness: awake, alert , oriented and patient cooperative  Airway & Oxygen Therapy: Patient Spontanous Breathing and Patient connected to face mask oxygen  Post-op Assessment: Report given to RN, Post -op Vital signs reviewed and stable and Patient moving all extremities  Post vital signs: Reviewed and stable  Last Vitals:  Vitals Value Taken Time  BP 112/68 01/19/19 0933  Temp    Pulse 65 01/19/19 0934  Resp 12 01/19/19 0934  SpO2 100 % 01/19/19 0934  Vitals shown include unvalidated device data.  Last Pain:  Vitals:   01/19/19 0601  TempSrc:   PainSc: 0-No pain         Complications: No apparent anesthesia complications

## 2019-01-19 NOTE — Op Note (Signed)
PREOP DIAGNOSIS: DJD LEFT KNEE POSTOP DIAGNOSIS:  same PROCEDURE: LEFT TKR ANESTHESIA: Spinal and MAC ATTENDING SURGEON: [name] ASSISTANT: Loni Dolly PA  INDICATIONS FOR PROCEDURE: Erica Stanley is a 68 y.o. female who has struggled for a long time with pain due to degenerative arthritis of the left knee.  The patient has failed many conservative non-operative measures and at this point has pain which limits the ability to sleep and walk.  The patient is offered total knee replacement.  Informed operative consent was obtained after discussion of possible risks of anesthesia, infection, neurovascular injury, DVT, and death.  The importance of the post-operative rehabilitation protocol to optimize result was stressed extensively with the patient.  SUMMARY OF FINDINGS AND PROCEDURE:  Erica Stanley was taken to the operative suite where under the above anesthesia a left knee replacement was performed.  There were advanced degenerative changes and the bone quality was good.  We used the DePuyAttune system and placed size 5 femur, 6 tibia, 38 mm all polyethylene patella, and a size 8 mm spacer.  Loni Dolly PA-C assisted throughout and was invaluable to the completion of the case in that he helped retract and maintain exposure while I placed the components.  He also helped close thereby minimizing OR time.  The patient was admitted for appropriate post-op care to include perioperative antibiotics and mechanical and pharmacologic measures for DVT prophylaxis.  DESCRIPTION OF PROCEDURE:  Erica Stanley was taken to the operative suite where the above anesthesia was applied.  The patient was positioned supine and prepped and draped in normal sterile fashion.  An appropriate time out was performed.  After the administration of kefzol pre-op antibiotic the leg was elevated and exsanguinated and a tourniquet inflated.  A standard longitudinal incision was made on the anterior knee.  Dissection was carried  down to the extensor mechanism.  All appropriate anti-infective measures were used including the pre-operative antibiotic, betadine impregnated drape, and closed hooded exhaust systems for each member of the surgical team.  A medial parapatellar incision was made in the extensor mechanism and the knee cap flipped and the knee flexed.  Some residual meniscal tissues were removed along with any remaining ACL/PCL tissue.  A guide was placed on the tibia and a flat cut was made on it's superior surface.  An intramedullary guide was placed in the femur and was utilized to make anterior and posterior cuts creating an appropriate flexion gap.  A second intramedullary guide was placed in the femur to make a distal cut properly balancing the knee with an extension gap equal to the flexion gap.  The three bones sized to the above mentioned sizes and the appropriate guides were placed and utilized.  A trial reduction was done and the knee easily came to full extension and the patella tracked well on flexion.  The trial components were removed and all bones were cleaned with pulsatile lavage and then dried thoroughly.  Cement was mixed and was pressurized onto the bones followed by placement of the aforementioned components.  Excess cement was trimmed and pressure was held on the components until the cement had hardened.  The tourniquet was deflated and a small amount of bleeding was controlled with cautery and pressure.  The knee was irrigated thoroughly.  The extensor mechanism was re-approximated with #1 vicryl in interrupted fashion.  The knee was flexed and the repair was solid.  The subcutaneous tissues were re-approximated with #0 and #2-0 vicryl and the skin closed with a  subcuticular stitch and steristrips.  A sterile dressing was applied.  Intraoperative fluids, EBL, and tourniquet time can be obtained from anesthesia records.  DISPOSITION:  The patient was taken to recovery room in stable condition and admitted for  appropriate post-op care to include peri-operative antibiotic and DVT prophylaxis with mechanical and pharmacologic measures.  Erica Stanley 01/19/2019, 9:09 AM

## 2019-01-19 NOTE — Anesthesia Procedure Notes (Signed)
Spinal  Patient location during procedure: OR Start time: 01/19/2019 7:28 AM End time: 01/19/2019 7:34 AM Staffing Anesthesiologist: Myrtie Soman, MD Performed: anesthesiologist  Preanesthetic Checklist Completed: patient identified, site marked, surgical consent, pre-op evaluation, timeout performed, IV checked, risks and benefits discussed and monitors and equipment checked Spinal Block Patient position: sitting Prep: Betadine Patient monitoring: heart rate, continuous pulse ox and blood pressure Location: L3-4 Injection technique: single-shot Needle Needle type: Sprotte  Needle gauge: 24 G Needle length: 9 cm Additional Notes Expiration date of kit checked and confirmed. Patient tolerated procedure well, without complications.

## 2019-01-20 ENCOUNTER — Encounter (HOSPITAL_COMMUNITY): Payer: Self-pay | Admitting: Orthopaedic Surgery

## 2019-01-20 DIAGNOSIS — M1712 Unilateral primary osteoarthritis, left knee: Secondary | ICD-10-CM | POA: Diagnosis not present

## 2019-01-20 MED ORDER — ONDANSETRON HCL 4 MG PO TABS: 4.0000 mg | ORAL_TABLET | Freq: Four times a day (QID) | ORAL | 0 refills | Status: AC | PRN

## 2019-01-20 MED ORDER — METHOCARBAMOL 500 MG PO TABS: 500.0000 mg | ORAL_TABLET | Freq: Four times a day (QID) | ORAL | 1 refills | Status: DC | PRN

## 2019-01-20 MED ORDER — HYDROCODONE-ACETAMINOPHEN 5-325 MG PO TABS: 1.0000 | ORAL_TABLET | Freq: Four times a day (QID) | ORAL | 0 refills | Status: DC | PRN

## 2019-01-20 MED ORDER — ASPIRIN 81 MG PO CHEW: 81.0000 mg | CHEWABLE_TABLET | Freq: Two times a day (BID) | ORAL | 0 refills | Status: DC

## 2019-01-20 NOTE — Progress Notes (Signed)
Subjective: 1 Day Post-Op Procedure(s) (LRB): Left Total Knee Arthroplasty (Left)   Patient feels well and has no real pain.  Activity level:  wbat Diet tolerance:  ok Voiding:  Foley out this morning Patient reports pain as mild.    Objective: Vital signs in last 24 hours: Temp:  [97.3 F (36.3 C)-97.8 F (36.6 C)] 97.6 F (36.4 C) (08/12 0453) Pulse Rate:  [57-76] 64 (08/12 0453) Resp:  [13-20] 16 (08/12 0453) BP: (95-165)/(62-127) 112/62 (08/12 0453) SpO2:  [99 %-100 %] 100 % (08/12 0453)  Labs: No results for input(s): HGB in the last 72 hours. No results for input(s): WBC, RBC, HCT, PLT in the last 72 hours. No results for input(s): NA, K, CL, CO2, BUN, CREATININE, GLUCOSE, CALCIUM in the last 72 hours. No results for input(s): LABPT, INR in the last 72 hours.  Physical Exam:  Neurologically intact ABD soft Neurovascular intact Sensation intact distally Intact pulses distally Dorsiflexion/Plantar flexion intact Incision: dressing C/D/I and no drainage No cellulitis present Compartment soft  Assessment/Plan:  1 Day Post-Op Procedure(s) (LRB): Left Total Knee Arthroplasty (Left) Advance diet Up with therapy D/C IV fluids Discharge home with home health today after PT. Continue on ASA 81mg  BID x 2 weeks post op. Follow up in office 2 weeks post op.    Erica Stanley 01/20/2019, 7:44 AM

## 2019-01-20 NOTE — Discharge Summary (Signed)
Patient ID: Erica Stanley MRN: [id number] DOB/AGE: [birth year] 68 y.o.  Admit date: 01/19/2019 Discharge date: 01/20/2019  Admission Diagnoses:  Principal Problem:   Primary osteoarthritis of left knee   Discharge Diagnoses:  Same  Past Medical History:  Diagnosis Date  . Complication of anesthesia   . Dysrhythmia     Lt BBB  . Pneumonia 1998   bactirial   . PONV (postoperative nausea and vomiting)     Surgeries: Procedure(s): Left Total Knee Arthroplasty on 01/19/2019   Consultants:   Discharged Condition: Improved  Hospital Course: Erica Stanley is an 68 y.o. female who was admitted 01/19/2019 for operative treatment ofPrimary osteoarthritis of left knee. Patient has severe unremitting pain that affects sleep, daily activities, and work/hobbies. After pre-op clearance the patient was taken to the operating room on 01/19/2019 and underwent  Procedure(s): Left Total Knee Arthroplasty.    Patient was given perioperative antibiotics:  Anti-infectives (From admission, onward)   Start     Dose/Rate Route Frequency Ordered Stop   01/19/19 1330  ceFAZolin (ANCEF) IVPB 2g/100 mL premix     2 g 200 mL/hr over 30 Minutes Intravenous Every 6 hours 01/19/19 0940 01/19/19 2007   01/19/19 0600  ceFAZolin (ANCEF) IVPB 2g/100 mL premix     2 g 200 mL/hr over 30 Minutes Intravenous On call to O.R. 01/19/19 4098 01/19/19 0741       Patient was given sequential compression devices, early ambulation, and chemoprophylaxis to prevent DVT.  Patient benefited maximally from hospital stay and there were no complications.    Recent vital signs:  Patient Vitals for the past 24 hrs:  BP Temp Temp src Pulse Resp SpO2  01/20/19 0453 112/62 97.6 F (36.4 C) Oral 64 16 100 %  01/20/19 0137 119/66 (!) 97.3 F (36.3 C) Oral 72 16 100 %  01/19/19 2126 130/82 - - 68 14 100 %  01/19/19 1640 (!) 145/90 (!) 97.5 F (36.4 C) Oral 76 16 100 %  01/19/19 1523 (!) 143/72 - - 65 17 100 %  01/19/19 1416  130/78 97.8 F (36.6 C) - 71 17 100 %  01/19/19 1315 95/65 - - 65 - 100 %  01/19/19 1245 (!) 163/75 97.6 F (36.4 C) - 64 20 100 %  01/19/19 1215 (!) 147/76 - - 71 15 100 %  01/19/19 1200 (!) 162/91 97.8 F (36.6 C) - 66 15 100 %  01/19/19 1145 (!) 159/85 - - (!) 58 13 99 %  01/19/19 1130 (!) 165/85 97.8 F (36.6 C) - 70 15 100 %  01/19/19 1100 (!) 150/127 - - 66 20 100 %  01/19/19 1045 (!) 154/83 97.8 F (36.6 C) - (!) 57 15 100 %  01/19/19 1030 (!) 147/79 - - (!) 57 15 100 %  01/19/19 1015 (!) 159/86 - - (!) 59 15 100 %  01/19/19 1000 (!) 154/64 97.8 F (36.6 C) - 63 13 100 %  01/19/19 0945 140/75 - - 65 15 100 %  01/19/19 0933 133/69 97.8 F (36.6 C) - - 15 100 %     Recent laboratory studies: No results for input(s): WBC, HGB, HCT, PLT, NA, K, CL, CO2, BUN, CREATININE, GLUCOSE, INR, CALCIUM in the last 72 hours.  Invalid input(s): PT, 2   Discharge Medications:   Allergies as of 01/20/2019      Reactions   Other Itching   Pt is allergic to polyester   Codeine Nausea And Vomiting   Hydrocortisone Hives   Menthol  Hives, Itching, Nausea And Vomiting, Swelling   Morphine And Related Nausea And Vomiting      Medication List    STOP taking these medications   Diclofenac Sodium CR 100 MG 24 hr tablet Commonly known as: Voltaren-XR     TAKE these medications   aspirin 81 MG chewable tablet Chew 1 tablet (81 mg total) by mouth 2 (two) times daily.   Calcium-Magnesium-Zinc Tabs Take 1 tablet by mouth 3 (three) times daily.   ELDERBERRY PO Take 1 capsule by mouth 2 (two) times daily.   HYDROcodone-acetaminophen 5-325 MG tablet Commonly known as: NORCO/VICODIN Take 1 tablet by mouth every 6 (six) hours as needed for moderate pain (pain score 4-6).   lactobacillus acidophilus Tabs tablet Take 2 tablets by mouth 3 (three) times daily.   methocarbamol 500 MG tablet Commonly known as: ROBAXIN Take 1 tablet (500 mg total) by mouth every 6 (six) hours as needed for  muscle spasms. What changed:   when to take this  reasons to take this   mometasone 0.1 % cream Commonly known as: ELOCON Apply 1 application topically daily as needed (irritation).   multivitamin with minerals Tabs tablet Take 1 tablet by mouth daily.   ondansetron 4 MG tablet Commonly known as: ZOFRAN Take 1 tablet (4 mg total) by mouth every 6 (six) hours as needed for nausea.   PANCREATIN PO Take 350 mg by mouth 3 (three) times daily.   Potassium 99 MG Tabs Take 99 mg by mouth 3 (three) times daily.   SIMILASAN NASAL ALLERGY RELIEF NA Place 2 sprays into both nostrils daily as needed (allergies).   SIMILASAN DRY EYE RELIEF OP Place 1 drop into both eyes 2 (two) times daily as needed (dry eyes).   TURMERIC PO Take 900 mg by mouth 3 (three) times daily.   vitamin C 1000 MG tablet Take 1,000 mg by mouth 3 (three) times daily.   Vitamin D 125 MCG (5000 UT) Caps Take 5,000 Units by mouth daily.            Durable Medical Equipment  (From admission, onward)         Start     Ordered   01/19/19 1321  DME Walker rolling  Once    Question:  Patient needs a walker to treat with the following condition  Answer:  Primary osteoarthritis of left knee   01/19/19 1320   01/19/19 1321  DME 3 n 1  Once     01/19/19 1320   01/19/19 1321  DME Bedside commode  Once    Question:  Patient needs a bedside commode to treat with the following condition  Answer:  Primary osteoarthritis of left knee   01/19/19 1320          Diagnostic Studies: Dg Chest 2 View  Result Date: 01/16/2019 CLINICAL DATA:  68 year old female under preoperative evaluation prior to knee replacement. EXAM: CHEST - 2 VIEW COMPARISON:  Chest x-ray 08/18/2015. FINDINGS: Lung volumes are normal. No consolidative airspace disease. No pleural effusions. No pneumothorax. No pulmonary nodule or mass noted. Pulmonary vasculature and the cardiomediastinal silhouette are within normal limits. IMPRESSION: No  radiographic evidence of acute cardiopulmonary disease. Electronically Signed   By: Trudie Reedaniel  Entrikin M.D.   On: 01/16/2019 14:05    Disposition: Discharge disposition: 01-Home or Self Care       Discharge Instructions    Call MD / Call 911   Complete by: As directed    If you experience  chest pain or shortness of breath, CALL 911 and be transported to the hospital emergency room.  If you develope a fever above 101 F, pus (white drainage) or increased drainage or redness at the wound, or calf pain, call your surgeon's office.   Constipation Prevention   Complete by: As directed    Drink plenty of fluids.  Prune juice may be helpful.  You may use a stool softener, such as Colace (over the counter) 100 mg twice a day.  Use MiraLax (over the counter) for constipation as needed.   Diet - low sodium heart healthy   Complete by: As directed    Discharge instructions   Complete by: As directed    INSTRUCTIONS AFTER JOINT REPLACEMENT   Remove items at home which could result in a fall. This includes throw rugs or furniture in walking pathways ICE to the affected joint every three hours while awake for 30 minutes at a time, for at least the first 3-5 days, and then as needed for pain and swelling.  Continue to use ice for pain and swelling. You may notice swelling that will progress down to the foot and ankle.  This is normal after surgery.  Elevate your leg when you are not up walking on it.   Continue to use the breathing machine you got in the hospital (incentive spirometer) which will help keep your temperature down.  It is common for your temperature to cycle up and down following surgery, especially at night when you are not up moving around and exerting yourself.  The breathing machine keeps your lungs expanded and your temperature down.   DIET:  As you were doing prior to hospitalization, we recommend a well-balanced diet.  DRESSING / WOUND CARE / SHOWERING  You may shower 3 days after  surgery, but keep the wounds dry during showering.  You may use an occlusive plastic wrap (Press'n Seal for example), NO SOAKING/SUBMERGING IN THE BATHTUB.  If the bandage gets wet, change with a clean dry gauze.  If the incision gets wet, pat the wound dry with a clean towel.  ACTIVITY  Increase activity slowly as tolerated, but follow the weight bearing instructions below.   No driving for 6 weeks or until further direction given by your physician.  You cannot drive while taking narcotics.  No lifting or carrying greater than 10 lbs. until further directed by your surgeon. Avoid periods of inactivity such as sitting longer than an hour when not asleep. This helps prevent blood clots.  You may return to work once you are authorized by your doctor.     WEIGHT BEARING   Weight bearing as tolerated with assist device (walker, cane, etc) as directed, use it as long as suggested by your surgeon or therapist, typically at least 4-6 weeks.   EXERCISES  Results after joint replacement surgery are often greatly improved when you follow the exercise, range of motion and muscle strengthening exercises prescribed by your doctor. Safety measures are also important to protect the joint from further injury. Any time any of these exercises cause you to have increased pain or swelling, decrease what you are doing until you are comfortable again and then slowly increase them. If you have problems or questions, call your caregiver or physical therapist for advice.   Rehabilitation is important following a joint replacement. After just a few days of immobilization, the muscles of the leg can become weakened and shrink (atrophy).  These exercises are designed to build up  the tone and strength of the thigh and leg muscles and to improve motion. Often times heat used for twenty to thirty minutes before working out will loosen up your tissues and help with improving the range of motion but do not use heat for the  first two weeks following surgery (sometimes heat can increase post-operative swelling).   These exercises can be done on a training (exercise) mat, on the floor, on a table or on a bed. Use whatever works the best and is most comfortable for you.    Use music or television while you are exercising so that the exercises are a pleasant break in your day. This will make your life better with the exercises acting as a break in your routine that you can look forward to.   Perform all exercises about fifteen times, three times per day or as directed.  You should exercise both the operative leg and the other leg as well.   Exercises include:   Quad Sets - Tighten up the muscle on the front of the thigh (Quad) and hold for 5-10 seconds.   Straight Leg Raises - With your knee straight (if you were given a brace, keep it on), lift the leg to 60 degrees, hold for 3 seconds, and slowly lower the leg.  Perform this exercise against resistance later as your leg gets stronger.  Leg Slides: Lying on your back, slowly slide your foot toward your buttocks, bending your knee up off the floor (only go as far as is comfortable). Then slowly slide your foot back down until your leg is flat on the floor again.  Angel Wings: Lying on your back spread your legs to the side as far apart as you can without causing discomfort.  Hamstring Strength:  Lying on your back, push your heel against the floor with your leg straight by tightening up the muscles of your buttocks.  Repeat, but this time bend your knee to a comfortable angle, and push your heel against the floor.  You may put a pillow under the heel to make it more comfortable if necessary.   A rehabilitation program following joint replacement surgery can speed recovery and prevent re-injury in the future due to weakened muscles. Contact your doctor or a physical therapist for more information on knee rehabilitation.    CONSTIPATION  Constipation is defined medically as  fewer than three stools per week and severe constipation as less than one stool per week.  Even if you have a regular bowel pattern at home, your normal regimen is likely to be disrupted due to multiple reasons following surgery.  Combination of anesthesia, postoperative narcotics, change in appetite and fluid intake all can affect your bowels.   YOU MUST use at least one of the following options; they are listed in order of increasing strength to get the job done.  They are all available over the counter, and you may need to use some, POSSIBLY even all of these options:    Drink plenty of fluids (prune juice may be helpful) and high fiber foods Colace 100 mg by mouth twice a day  Senokot for constipation as directed and as needed Dulcolax (bisacodyl), take with full glass of water  Miralax (polyethylene glycol) once or twice a day as needed.  If you have tried all these things and are unable to have a bowel movement in the first 3-4 days after surgery call either your surgeon or your primary doctor.    If you  experience loose stools or diarrhea, hold the medications until you stool forms back up.  If your symptoms do not get better within 1 week or if they get worse, check with your doctor.  If you experience "the worst abdominal pain ever" or develop nausea or vomiting, please contact the office immediately for further recommendations for treatment.   ITCHING:  If you experience itching with your medications, try taking only a single pain pill, or even half a pain pill at a time.  You can also use Benadryl over the counter for itching or also to help with sleep.   TED HOSE STOCKINGS:  Use stockings on both legs until for at least 2 weeks or as directed by physician office. They may be removed at night for sleeping.  MEDICATIONS:  See your medication summary on the "After Visit Summary" that nursing will review with you.  You may have some home medications which will be placed on hold until you  complete the course of blood thinner medication.  It is important for you to complete the blood thinner medication as prescribed.  PRECAUTIONS:  If you experience chest pain or shortness of breath - call 911 immediately for transfer to the hospital emergency department.   If you develop a fever greater that 101 F, purulent drainage from wound, increased redness or drainage from wound, foul odor from the wound/dressing, or calf pain - CONTACT YOUR SURGEON.                                                   FOLLOW-UP APPOINTMENTS:  If you do not already have a post-op appointment, please call the office for an appointment to be seen by your surgeon.  Guidelines for how soon to be seen are listed in your "After Visit Summary", but are typically between 1-4 weeks after surgery.  OTHER INSTRUCTIONS:   Knee Replacement:  Do not place pillow under knee, focus on keeping the knee straight while resting. CPM instructions: 0-90 degrees, 2 hours in the morning, 2 hours in the afternoon, and 2 hours in the evening. Place foam block, curve side up under heel at all times except when in CPM or when walking.  DO NOT modify, tear, cut, or change the foam block in any way.  MAKE SURE YOU:  Understand these instructions.  Get help right away if you are not doing well or get worse.    Thank you for letting us be a part of your medical care team.  It is a privilege we respect greatly.  We hope these instructions will help you stay on track for a fast and full recovery!   Increase activity slowly as tolerated   Complete by: As directed       Follow-up Information    Marcene Corningalldorf, Peter, MD. Go on 01/29/2019.   Specialty: Orthopedic Surgery Why: Your appointment has been scheduled for 2:00 Contact information: [address] [place] [address] [phone]        [facility] Follow up.   Specialty: Home Health Services Why: You will be seen by HHPT for 5 visits prior to starting outpatient physical  therapy  Contact information: [address] [place] [address] [phone]        [facility] [place] Go on 01/29/2019.   Why: You are scheduled to  start outpatient physical therapy at 3:40. Please go directly over to the therapy side after your appointment with Dr. Rhona Raider to complete your paperwork  Contact information: [facility] [place] [address] [phone]            Signed: Larwance Sachs Charrie Mcconnon 01/20/2019, 7:48 AM

## 2019-01-20 NOTE — Progress Notes (Signed)
Physical Therapy Treatment Patient Details Name: Erica Stanley MRN: [id number] DOB: [birth year] Today's Date: 01/20/2019    History of Present Illness 68 yo female s/p L TKR on 01/19/19. PMH includes LBBB, PNA.    PT Comments    Progressing well with mobility. Reviewed/practiced exercises and gait training. All education completed. Okay to d/c from PT standpoint.    Follow Up Recommendations  Follow surgeon's recommendation for DC plan and follow-up therapies     Equipment Recommendations  None recommended by PT    Recommendations for Other Services       Precautions / Restrictions Precautions Precautions: Fall Restrictions Weight Bearing Restrictions: No Other Position/Activity Restrictions: WBAT    Mobility  Bed Mobility Overal bed mobility: Needs Assistance Bed Mobility: Sit to Supine     Supine to sit: Min guard Sit to supine: Min guard      Transfers Overall transfer level: Needs assistance Equipment used: Rolling walker (2 wheeled) Transfers: Sit to/from Stand Sit to Stand: Min guard         General transfer comment: Close guard for safety. Increased time. VCs hand placement.  Ambulation/Gait Ambulation/Gait assistance: Min guard Gait Distance (Feet): 160 Feet Assistive device: Rolling walker (2 wheeled) Gait Pattern/deviations: Step-through pattern;Decreased stride length     General Gait Details: Close guard for safety.   Stairs             Wheelchair Mobility    Modified Rankin (Stroke Patients Only)       Balance Overall balance assessment: Mild deficits observed, not formally tested                                          Cognition Arousal/Alertness: Awake/alert Behavior During Therapy: WFL for tasks assessed/performed Overall Cognitive Status: Within Functional Limits for tasks assessed                                        Exercises Total Joint Exercises Ankle Circles/Pumps:  AROM;Both;10 reps;Supine Quad Sets: AROM;Both;10 reps;Supine Hip ABduction/ADduction: AROM;Left;10 reps;Supine Straight Leg Raises: AROM;Left;10 reps;Supine Knee Flexion: AROM;Left;10 reps;Seated Goniometric ROM: ~5-90 degrees    General Comments        Pertinent Vitals/Pain Pain Assessment: 0-10 Pain Score: 4  Pain Location: L knee Pain Descriptors / Indicators: Sore Pain Intervention(s): Monitored during session;Ice applied    Home Living                      Prior Function            PT Goals (current goals can now be found in the care plan section) Progress towards PT goals: Progressing toward goals    Frequency    7X/week      PT Plan      Co-evaluation              AM-PAC PT "6 Clicks" Mobility   Outcome Measure  Help needed turning from your back to your side while in a flat bed without using bedrails?: A Little Help needed moving from lying on your back to sitting on the side of a flat bed without using bedrails?: A Little Help needed moving to and from a bed to a chair (including a wheelchair)?: A Little Help needed standing up from a chair using  your arms (e.g., wheelchair or bedside chair)?: A Little Help needed to walk in hospital room?: A Little Help needed climbing 3-5 steps with a railing? : A Little 6 Click Score: 18    End of Session Equipment Utilized During Treatment: Gait belt Activity Tolerance: Patient tolerated treatment well Patient left: in bed;with call bell/phone within reach   PT Visit Diagnosis: Other abnormalities of gait and mobility (R26.89)     Time: [id number] PT Time Calculation (min) (ACUTE ONLY): 19 min  Charges:  $Gait Training: 8-22 mins                        Rebeca AlertJannie Amirr Achord, PT Acute Rehabilitation Services Pager: [phone] Office: [phone]

## 2019-01-20 NOTE — Progress Notes (Signed)
The pt was provided with d/c instructions. After discussing the pt's plan of care upon d/c home, the pt reported no further questions or concerns.  

## 2019-05-21 ENCOUNTER — Other Ambulatory Visit: Payer: Self-pay | Admitting: Orthopaedic Surgery

## 2019-06-02 ENCOUNTER — Encounter (HOSPITAL_COMMUNITY): Payer: Self-pay

## 2019-06-02 NOTE — Patient Instructions (Addendum)
DUE TO COVID-19 ONLY ONE VISITOR IS ALLOWED TO COME WITH YOU AND STAY IN THE WAITING ROOM ONLY DURING PRE OP AND PROCEDURE. THE ONE VISITOR MAY VISIT WITH YOU IN YOUR PRIVATE ROOM DURING VISITING HOURS ONLY!!   COVID SWAB TESTING MUST BE COMPLETED ON:  Saturday, Jan 2. 2021 at 12:25PM  [address] [place] -[facility] enter pre surgical testing line (Must self quarantine after testing. Follow instructions on handout.)             Your procedure is scheduled on: Tuesday, Jan. 5, 2021   Report to [facility] Main  Entrance   Report to Short Stay at 5:30 AM   Call this number if you have problems the morning of surgery [phone]   Do not eat food:After Midnight.   May have liquids until 4:30 AM day of surgery   CLEAR LIQUID DIET  Foods Allowed                                                                     Foods Excluded  Water, Black Coffee and tea, regular and decaf                             liquids that you cannot  Plain Jell-O in any flavor  (No red)                                           see through such as: Fruit ices (not with fruit pulp)                                     milk, soups, orange juice  Iced Popsicles (No red)                                    All solid food Carbonated beverages, regular and diet                                    Apple juices Sports drinks like Gatorade (No red) Lightly seasoned clear broth or consume(fat free) Sugar, honey syrup  Sample Menu Breakfast                                Lunch                                     Supper Cranberry juice                    Beef broth                            Chicken broth Jell-O  Grape juice                           Apple juice Coffee or tea                        Jell-O                                      Popsicle                                                Coffee or tea                        Coffee or  tea   Complete one G2 drink the morning of surgery at 4:30AM the day of surgery.   Brush your teeth the morning of surgery.   Do NOT smoke after Midnight   Take these medicines the morning of surgery with A SIP OF WATER: None                               You may not have any metal on your body including hair pins, jewelry, and body piercings             Do not wear make-up, lotions, powders, perfumes/cologne, or deodorant             Do not wear nail polish.  Do not shave  48 hours prior to surgery.               Do not bring valuables to the hospital. [facility] IS NOT             RESPONSIBLE   FOR VALUABLES.   Contacts, dentures or bridgework may not be worn into surgery.   Bring small overnight bag day of surgery.    Patients discharged the day of surgery will not be allowed to drive home.   Special Instructions: Bring a copy of your healthcare power of attorney and living will documents         the day of surgery if you haven't scanned them in before.              Please read over the following fact sheets you were given:  [facility] - Preparing for Surgery Before surgery, you can play an important role.  Because skin is not sterile, your skin needs to be as free of germs as possible.  You can reduce the number of germs on your skin by washing with CHG (chlorahexidine gluconate) soap before surgery.  CHG is an antiseptic cleaner which kills germs and bonds with the skin to continue killing germs even after washing. Please DO NOT use if you have an allergy to CHG or antibacterial soaps.  If your skin becomes reddened/irritated stop using the CHG and inform your nurse when you arrive at Short Stay. Do not shave (including legs and underarms) for at least 48 hours prior to the first CHG shower.  You may shave your face/neck.  Please follow these instructions carefully:  1.  Shower with CHG Soap the night before surgery and  the  morning of surgery.  2.  If you choose to wash your  hair, wash your hair first as usual with your normal  shampoo.  3.  After you shampoo, rinse your hair and body thoroughly to remove the shampoo.                             4.  Use CHG as you would any other liquid soap.  You can apply chg directly to the skin and wash.  Gently with a scrungie or clean washcloth.  5.  Apply the CHG Soap to your body ONLY FROM THE NECK DOWN.   Do   not use on face/ open                           Wound or open sores. Avoid contact with eyes, ears mouth and   genitals (private parts).                       Wash face,  Genitals (private parts) with your normal soap.             6.  Wash thoroughly, paying special attention to the area where your    surgery  will be performed.  7.  Thoroughly rinse your body with warm water from the neck down.  8.  DO NOT shower/wash with your normal soap after using and rinsing off the CHG Soap.                9.  Pat yourself dry with a clean towel.            10.  Wear clean pajamas.            11.  Place clean sheets on your bed the night of your first shower and do not  sleep with pets. Day of Surgery : Do not apply any lotions/deodorants the morning of surgery.  Please wear clean clothes to the hospital/surgery center.  FAILURE TO FOLLOW THESE INSTRUCTIONS MAY RESULT IN THE CANCELLATION OF YOUR SURGERY  PATIENT SIGNATURE_________________________________  NURSE SIGNATURE__________________________________  ________________________________________________________________________   Rogelia MireIncentive Spirometer  An incentive spirometer is a tool that can help keep your lungs clear and active. This tool measures how well you are filling your lungs with each breath. Taking long deep breaths may help reverse or decrease the chance of developing breathing (pulmonary) problems (especially infection) following:  A long period of time when you are unable to move or be active. BEFORE THE PROCEDURE   If the spirometer includes an  indicator to show your best effort, your nurse or respiratory therapist will set it to a desired goal.  If possible, sit up straight or lean slightly forward. Try not to slouch.  Hold the incentive spirometer in an upright position. INSTRUCTIONS FOR USE  1. Sit on the edge of your bed if possible, or sit up as far as you can in bed or on a chair. 2. Hold the incentive spirometer in an upright position. 3. Breathe out normally. 4. Place the mouthpiece in your mouth and seal your lips tightly around it. 5. Breathe in slowly and as deeply as possible, raising the piston or the ball toward the top of the column. 6. Hold your breath for 3-5 seconds or for as long as possible. Allow the piston or ball to fall to the bottom of the  column. 7. Remove the mouthpiece from your mouth and breathe out normally. 8. Rest for a few seconds and repeat Steps 1 through 7 at least 10 times every 1-2 hours when you are awake. Take your time and take a few normal breaths between deep breaths. 9. The spirometer may include an indicator to show your best effort. Use the indicator as a goal to work toward during each repetition. 10. After each set of 10 deep breaths, practice coughing to be sure your lungs are clear. If you have an incision (the cut made at the time of surgery), support your incision when coughing by placing a pillow or rolled up towels firmly against it. Once you are able to get out of bed, walk around indoors and cough well. You may stop using the incentive spirometer when instructed by your caregiver.  RISKS AND COMPLICATIONS  Take your time so you do not get dizzy or light-headed.  If you are in pain, you may need to take or ask for pain medication before doing incentive spirometry. It is harder to take a deep breath if you are having pain. AFTER USE  Rest and breathe slowly and easily.  It can be helpful to keep track of a log of your progress. Your caregiver can provide you with a simple table  to help with this. If you are using the spirometer at home, follow these instructions: SEEK MEDICAL CARE IF:   You are having difficultly using the spirometer.  You have trouble using the spirometer as often as instructed.  Your pain medication is not giving enough relief while using the spirometer.  You develop fever of 100.5 F (38.1 C) or higher. SEEK IMMEDIATE MEDICAL CARE IF:   You cough up bloody sputum that had not been present before.  You develop fever of 102 F (38.9 C) or greater.  You develop worsening pain at or near the incision site. MAKE SURE YOU:   Understand these instructions.  Will watch your condition.  Will get help right away if you are not doing well or get worse. Document Released: 10/07/2006 Document Revised: 08/19/2011 Document Reviewed: 12/08/2006 Exit[employer] Patient Information 2014 ExitCare, [place].   ________________________________________________________________________  WHAT IS A BLOOD TRANSFUSION? Blood Transfusion Information  A transfusion is the replacement of blood or some of its parts. Blood is made up of multiple cells which provide different functions.  Red blood cells carry oxygen and are used for blood loss replacement.  White blood cells fight against infection.  Platelets control bleeding.  Plasma helps clot blood.  Other blood products are available for specialized needs, such as hemophilia or other clotting disorders. BEFORE THE TRANSFUSION  Who gives blood for transfusions?   Healthy volunteers who are fully evaluated to make sure their blood is safe. This is blood bank blood. Transfusion therapy is the safest it has ever been in the practice of medicine. Before blood is taken from a donor, a complete history is taken to make sure that person has no history of diseases nor engages in risky social behavior (examples are intravenous drug use or sexual activity with multiple partners). The donor's travel history is screened  to minimize risk of transmitting infections, such as malaria. The donated blood is tested for signs of infectious diseases, such as HIV and hepatitis. The blood is then tested to be sure it is compatible with you in order to minimize the chance of a transfusion reaction. If you or a relative donates blood, this is often done  in anticipation of surgery and is not appropriate for emergency situations. It takes many days to process the donated blood. RISKS AND COMPLICATIONS Although transfusion therapy is very safe and saves many lives, the main dangers of transfusion include:   Getting an infectious disease.  Developing a transfusion reaction. This is an allergic reaction to something in the blood you were given. Every precaution is taken to prevent this. The decision to have a blood transfusion has been considered carefully by your caregiver before blood is given. Blood is not given unless the benefits outweigh the risks. AFTER THE TRANSFUSION  Right after receiving a blood transfusion, you will usually feel much better and more energetic. This is especially true if your red blood cells have gotten low (anemic). The transfusion raises the level of the red blood cells which carry oxygen, and this usually causes an energy increase.  The nurse administering the transfusion will monitor you carefully for complications. HOME CARE INSTRUCTIONS  No special instructions are needed after a transfusion. You may find your energy is better. Speak with your caregiver about any limitations on activity for underlying diseases you may have. SEEK MEDICAL CARE IF:   Your condition is not improving after your transfusion.  You develop redness or irritation at the intravenous (IV) site. SEEK IMMEDIATE MEDICAL CARE IF:  Any of the following symptoms occur over the next 12 hours:  Shaking chills.  You have a temperature by mouth above 102 F (38.9 C), not controlled by medicine.  Chest, back, or muscle  pain.  People around you feel you are not acting correctly or are confused.  Shortness of breath or difficulty breathing.  Dizziness and fainting.  You get a rash or develop hives.  You have a decrease in urine output.  Your urine turns a dark color or changes to pink, red, or brown. Any of the following symptoms occur over the next 10 days:  You have a temperature by mouth above 102 F (38.9 C), not controlled by medicine.  Shortness of breath.  Weakness after normal activity.  The white part of the eye turns yellow (jaundice).  You have a decrease in the amount of urine or are urinating less often.  Your urine turns a dark color or changes to pink, red, or brown. Document Released: 05/24/2000 Document Revised: 08/19/2011 Document Reviewed: 01/11/2008 [facility] Patient Information 2014 [place], [place].  _______________________________________________________________________

## 2019-06-07 ENCOUNTER — Encounter (HOSPITAL_COMMUNITY)
Admission: RE | Admit: 2019-06-07 | Discharge: 2019-06-07 | Disposition: A | Discharge status: Home / Self Care | Payer: Medicare Other | Source: Ambulatory Visit | Attending: Orthopaedic Surgery | Admitting: Orthopaedic Surgery

## 2019-06-07 ENCOUNTER — Other Ambulatory Visit: Payer: Self-pay

## 2019-06-07 ENCOUNTER — Encounter (HOSPITAL_COMMUNITY): Payer: Self-pay

## 2019-06-07 DIAGNOSIS — Z01818 Encounter for other preprocedural examination: Secondary | ICD-10-CM | POA: Insufficient documentation

## 2019-06-07 HISTORY — DX: Personal history of diseases of the blood and blood-forming organs and certain disorders involving the immune mechanism: Z86.2

## 2019-06-07 HISTORY — DX: Cardiac murmur, unspecified: R01.1

## 2019-06-07 HISTORY — DX: Hyperlipidemia, unspecified: E78.5

## 2019-06-07 HISTORY — DX: Vitamin D deficiency, unspecified: E55.9

## 2019-06-07 HISTORY — DX: Prediabetes: R73.03

## 2019-06-07 HISTORY — DX: Allergic rhinitis, unspecified: J30.9

## 2019-06-07 HISTORY — DX: Unilateral primary osteoarthritis, unspecified knee: M17.10

## 2019-06-07 HISTORY — DX: Unspecified osteoarthritis, unspecified site: M19.90

## 2019-06-07 HISTORY — DX: Osteoarthritis of knee, unspecified: M17.9

## 2019-06-07 HISTORY — DX: Complex regional pain syndrome I, unspecified: G90.50

## 2019-06-07 NOTE — Progress Notes (Signed)
PCP - Dr. Raliegh Ip. [name] last office visit 04/2019 per pt. Cardiologist - N/A  Chest x-ray - 01/16/2019 in epic EKG - 01/15/2019 in epic Stress Test - greater than 2 years ECHO - greater than 2 years Cardiac Cath - N/A  Sleep Study - N/A CPAP - N/A  Fasting Blood Sugar - N/A Checks Blood Sugar _N/A____ times a day  Blood Thinner Instructions:N/A   Aspirin Instructions:  Yes Last Dose: per pt it has been weeks since she has taken any but will definitely not take any after 06/08/2019   Anesthesia review: N/A  Patient denies shortness of breath, fever, cough and chest pain at PAT appointment   Patient verbalized understanding of instructions that were given to them at the PAT appointment. Patient was also instructed that they will need to review over the PAT instructions again at home before surgery.

## 2019-06-09 ENCOUNTER — Encounter (HOSPITAL_COMMUNITY)
Admission: RE | Admit: 2019-06-09 | Discharge: 2019-06-09 | Disposition: A | Discharge status: Home / Self Care | Payer: Medicare Other | Source: Ambulatory Visit | Attending: Orthopaedic Surgery | Admitting: Orthopaedic Surgery

## 2019-06-09 ENCOUNTER — Other Ambulatory Visit: Payer: Self-pay

## 2019-06-09 DIAGNOSIS — Z01812 Encounter for preprocedural laboratory examination: Secondary | ICD-10-CM | POA: Insufficient documentation

## 2019-06-09 DIAGNOSIS — Z01818 Encounter for other preprocedural examination: Secondary | ICD-10-CM | POA: Diagnosis not present

## 2019-06-09 LAB — URINALYSIS, ROUTINE W REFLEX MICROSCOPIC
Bilirubin Urine: NEGATIVE
Glucose, UA: NEGATIVE mg/dL
Hgb urine dipstick: NEGATIVE
Ketones, ur: NEGATIVE mg/dL
Leukocytes,Ua: NEGATIVE
Nitrite: NEGATIVE
Protein, ur: NEGATIVE mg/dL
Specific Gravity, Urine: 1.003 — ABNORMAL LOW (ref 1.005–1.030)
pH: 7 (ref 5.0–8.0)

## 2019-06-09 LAB — SURGICAL PCR SCREEN
MRSA, PCR: NEGATIVE
Staphylococcus aureus: POSITIVE — AB

## 2019-06-09 LAB — CBC WITH DIFFERENTIAL/PLATELET
Abs Immature Granulocytes: 0.03 10*3/uL (ref 0.00–0.07)
Basophils Absolute: 0.1 10*3/uL (ref 0.0–0.1)
Basophils Relative: 1 %
Eosinophils Absolute: 0.3 10*3/uL (ref 0.0–0.5)
Eosinophils Relative: 5 %
HCT: 42 % (ref 36.0–46.0)
Hemoglobin: 13.3 g/dL (ref 12.0–15.0)
Immature Granulocytes: 1 %
Lymphocytes Relative: 27 %
Lymphs Abs: 1.4 10*3/uL (ref 0.7–4.0)
MCH: 29.7 pg (ref 26.0–34.0)
MCHC: 31.7 g/dL (ref 30.0–36.0)
MCV: 93.8 fL (ref 80.0–100.0)
Monocytes Absolute: 0.6 10*3/uL (ref 0.1–1.0)
Monocytes Relative: 11 %
Neutro Abs: 3 10*3/uL (ref 1.7–7.7)
Neutrophils Relative %: 55 %
Platelets: 275 10*3/uL (ref 150–400)
RBC: 4.48 MIL/uL (ref 3.87–5.11)
RDW: 14.5 % (ref 11.5–15.5)
WBC: 5.3 10*3/uL (ref 4.0–10.5)
nRBC: 0 % (ref 0.0–0.2)

## 2019-06-09 LAB — HEMOGLOBIN A1C
Hgb A1c MFr Bld: 5.7 % — ABNORMAL HIGH (ref 4.8–5.6)
Mean Plasma Glucose: 116.89 mg/dL

## 2019-06-09 LAB — BASIC METABOLIC PANEL
Anion gap: 8 (ref 5–15)
BUN: 20 mg/dL (ref 8–23)
CO2: 29 mmol/L (ref 22–32)
Calcium: 9.7 mg/dL (ref 8.9–10.3)
Chloride: 103 mmol/L (ref 98–111)
Creatinine, Ser: 0.56 mg/dL (ref 0.44–1.00)
GFR calc Af Amer: >60 mL/min (ref >60)
GFR calc non Af Amer: >60 mL/min (ref >60)
Glucose, Bld: 98 mg/dL (ref 70–99)
Potassium: 4.5 mmol/L (ref 3.5–5.1)
Sodium: 140 mmol/L (ref 135–145)

## 2019-06-09 LAB — APTT: aPTT: 28 seconds (ref 24–36)

## 2019-06-09 LAB — PROTIME-INR
INR: 1 (ref 0.8–1.2)
Prothrombin Time: 12.8 seconds (ref 11.4–15.2)

## 2019-06-12 ENCOUNTER — Other Ambulatory Visit (HOSPITAL_COMMUNITY)
Admission: RE | Admit: 2019-06-12 | Discharge: 2019-06-12 | Disposition: A | Discharge status: Home / Self Care | Payer: Medicare Other | Source: Ambulatory Visit | Attending: Orthopaedic Surgery | Admitting: Orthopaedic Surgery

## 2019-06-12 DIAGNOSIS — Z01812 Encounter for preprocedural laboratory examination: Secondary | ICD-10-CM | POA: Diagnosis present

## 2019-06-12 DIAGNOSIS — Z20822 Contact with and (suspected) exposure to covid-19: Secondary | ICD-10-CM | POA: Insufficient documentation

## 2019-06-12 LAB — SARS CORONAVIRUS 2 (TAT 6-24 HRS): SARS Coronavirus 2: NEGATIVE

## 2019-06-14 ENCOUNTER — Encounter (HOSPITAL_COMMUNITY): Payer: Self-pay | Admitting: Orthopaedic Surgery

## 2019-06-14 MED ORDER — BUPIVACAINE LIPOSOME 1.3 % IJ SUSP
20.0000 mL | Freq: Once | INTRAMUSCULAR | Status: DC
  Filled 2019-06-14: qty 20

## 2019-06-14 MED ORDER — TRANEXAMIC ACID 1000 MG/10ML IV SOLN
2000.0000 mg | INTRAVENOUS | Status: DC
  Filled 2019-06-14: qty 20

## 2019-06-14 NOTE — Care Plan (Signed)
Ortho Bundle Case Management Note  Patient Details  Name: Erica Stanley MRN: [id number] Date of Birth: January 26, 1951  Spoke with patient prior to surgery. She is planning to discharge to home with family and HHPT. Referral to [facility] and set up with [facility] to start OPPT on 06/25/19 @ 1040. SHe has all needed equipment from prior surgery. Patient and MD in agreement with plan. Choice offered.  DME Arranged:    DME Agency:     HH Arranged:  PT HH Agency:  [facility] ([facility]  Additional Comments: Please contact me with any questions of if this plan should need to change.  Shauna Hugh,  RN,BSN,MHA,CCM  [facility] Orthopaedic Specialist  [phone] 06/14/2019, 10:51 AM

## 2019-06-14 NOTE — H&P (Signed)
TOTAL KNEE ADMISSION H&P  Patient is being admitted for right total knee arthroplasty.  Subjective:  Chief Complaint:right knee pain.  HPI: [employer], 69 y.o. female, has a history of pain and functional disability in the right knee due to arthritis and has failed non-surgical conservative treatments for greater than 12 weeks to includeNSAID's and/or analgesics, corticosteriod injections, viscosupplementation injections, flexibility and strengthening excercises, supervised PT with diminished ADL's post treatment, use of assistive devices, weight reduction as appropriate and activity modification.  Onset of symptoms was gradual, starting 5 years ago with gradually worsening course since that time. The patient noted no past surgery on the right knee(s).  Patient currently rates pain in the right knee(s) at 10 out of 10 with activity. Patient has night pain, worsening of pain with activity and weight bearing, pain that interferes with activities of daily living, crepitus and joint swelling.  Patient has evidence of subchondral cysts, subchondral sclerosis, periarticular osteophytes and joint space narrowing by imaging studies.  Patient Active Problem List   Diagnosis Date Noted  . Primary osteoarthritis of left knee 01/19/2019   Past Medical History:  Diagnosis Date  . Allergic rhinitis   . Arthritis    right wrist  . Complication of anesthesia   . CRPS (complex regional pain syndrome type I)   . DJD (degenerative joint disease) of knee   . Dysrhythmia     Lt BBB  . Heart murmur    childhood, out grew  . History of iron deficiency anemia    during menstrual cycles  . Hyperlipidemia   . Pneumonia 1998   bacterial  . PONV (postoperative nausea and vomiting)   . Pre-diabetes    history of, has lost weight  . Vitamin D deficiency     Past Surgical History:  Procedure Laterality Date  . ABDOMINAL HYSTERECTOMY  1994  . BILATERAL OOPHORECTOMY    . CLOSED REDUCTION TOE FRACTURE Left  1968  . MANDIBLE RECONSTRUCTION  1989  . TONSILLECTOMY  1959  . TOTAL KNEE ARTHROPLASTY Left 01/19/2019   Procedure: Left Total Knee Arthroplasty;  Surgeon: Melrose Nakayama, MD;  Location: [facility] ORS;  Service: Orthopedics;  Laterality: Left;  . WRIST SURGERY Right     Current Facility-Administered Medications  Medication Dose Route Frequency Provider Last Rate Last Admin  . [START ON 06/15/2019] bupivacaine liposome (EXPAREL) 1.3 % injection 266 mg  20 mL Other Once Melrose Nakayama, MD      . Derrill Memo ON 06/15/2019] tranexamic acid (CYKLOKAPRON) 2,000 mg in sodium chloride 0.9 % 50 mL Topical Application  2,409 mg Topical To OR Melrose Nakayama, MD       Current Outpatient Medications  Medication Sig Dispense Refill Last Dose  . Ascorbic Acid (VITAMIN C) 1000 MG tablet Take 1,000 mg by mouth 3 (three) times daily.     Marland Kitchen aspirin 81 MG chewable tablet Chew 1 tablet (81 mg total) by mouth 2 (two) times daily. 30 tablet 0   . Cholecalciferol (VITAMIN D) 125 MCG (5000 UT) CAPS Take 5,000 Units by mouth daily.     . clotrimazole-betamethasone (LOTRISONE) cream Apply 1 application topically 2 (two) times daily as needed for irritation.     Marland Kitchen ELDERBERRY PO Take 1 capsule by mouth 2 (two) times daily.     . Homeopathic Products (SIMILASAN DRY EYE RELIEF OP) Place 1 drop into both eyes 2 (two) times daily as needed (dry eyes).     . Homeopathic Products [facility] NASAL ALLERGY RELIEF NA) Place 2 sprays  into both nostrils daily as needed (allergies).     . lactobacillus acidophilus (BACID) TABS tablet Take 2 tablets by mouth 3 (three) times daily.     . Multiple Minerals (CALCIUM-MAGNESIUM-ZINC) TABS Take 1 tablet by mouth 3 (three) times daily.     . Multiple Vitamin (MULTIVITAMIN WITH MINERALS) TABS tablet Take 1 tablet by mouth daily.     . ondansetron (ZOFRAN) 4 MG tablet Take 1 tablet (4 mg total) by mouth every 6 (six) hours as needed for nausea. 20 tablet 0   . PANCREATIN PO Take 350 mg by mouth 3 (three)  times daily.     . Potassium 99 MG TABS Take 99 mg by mouth 3 (three) times daily.     . TURMERIC PO Take 900 mg by mouth 3 (three) times daily.      Allergies  Allergen Reactions  . Other Itching    Pt is allergic to polyester; synthetic materials spandex unable to wear ted hoses  . Codeine Nausea And Vomiting  . Hydrocortisone Hives  . Menthol Hives, Itching, Nausea And Vomiting and Swelling  . Morphine And Related Nausea And Vomiting    Social History   Tobacco Use  . Smoking status: Former Smoker    Packs/day: 0.25    Years: 5.00    Pack years: 1.25    Types: Cigarettes    Quit date: 1984    Years since quitting: 37.0  . Smokeless tobacco: Never Used  Substance Use Topics  . Alcohol use: Yes    Comment: rare    No family history on file.   Review of Systems  Musculoskeletal: Positive for arthralgias.       Right knee  All other systems reviewed and are negative.   Objective:  Physical Exam  Constitutional: She is oriented to person, place, and time. She appears well-developed and well-nourished.  HENT:  Head: Normocephalic and atraumatic.  Eyes: Pupils are equal, round, and reactive to light.  Cardiovascular: Normal rate and regular rhythm.  Respiratory: Effort normal.  GI: Soft.  Musculoskeletal:     Cervical back: Normal range of motion.     Comments: Examination of the right knee shows range of motion from 0-120 of flexion.  1+ crepitation.  She has tenderness to palpation along her patellar facet and joint line.  No effusion today.  Her ligaments are stable.  Her calf is soft and nontender.  She is neurovascularly intact distally.    Neurological: She is alert and oriented to person, place, and time.  Skin: Skin is warm and dry.  Psychiatric: Her behavior is normal. Judgment and thought content normal.    Vital signs in last 24 hours:    Labs:   Estimated body mass index is 32.93 kg/m as calculated from the following:   Height as of 06/09/19: 5'  6" (1.676 m).   Weight as of 06/09/19: 92.5 kg.   Imaging Review Plain radiographs demonstrate severe degenerative joint disease of the right knee(s). The overall alignment isneutral. The bone quality appears to be good for age and reported activity level.      Assessment/Plan:  End stage primary arthritis, right knee   The patient history, physical examination, clinical judgment of the provider and imaging studies are consistent with end stage degenerative joint disease of the right knee(s) and total knee arthroplasty is deemed medically necessary. The treatment options including medical management, injection therapy arthroscopy and arthroplasty were discussed at length. The risks and benefits of total knee  arthroplasty were presented and reviewed. The risks due to aseptic loosening, infection, stiffness, patella tracking problems, thromboembolic complications and other imponderables were discussed. The patient acknowledged the explanation, agreed to proceed with the plan and consent was signed. Patient is being admitted for inpatient treatment for surgery, pain control, PT, OT, prophylactic antibiotics, VTE prophylaxis, progressive ambulation and ADL's and discharge planning. The patient is planning to be discharged home with home health services     Patient's anticipated LOS is less than 2 midnights, meeting these requirements: - Younger than 27 - Lives within 1 hour of care - Has a competent adult at home to recover with post-op recover - NO history of  - Chronic pain requiring opiods  - Diabetes  - Coronary Artery Disease  - Heart failure  - Heart attack  - Stroke  - DVT/VTE  - Cardiac arrhythmia  - Respiratory Failure/COPD  - Renal failure  - Anemia  - Advanced Liver disease

## 2019-06-14 NOTE — Anesthesia Preprocedure Evaluation (Addendum)
Anesthesia Evaluation  Patient identified by MRN, date of birth, ID band Patient awake    Reviewed: Allergy & Precautions, NPO status , Patient's Chart, lab work & pertinent test results  History of Anesthesia Complications (+) PONV and history of anesthetic complications  Airway Mallampati: II  TM Distance: >3 FB Neck ROM: Full    Dental no notable dental hx. (+) Teeth Intact   Pulmonary pneumonia, resolved, former smoker,    Pulmonary exam normal breath sounds clear to auscultation       Cardiovascular negative cardio ROS Normal cardiovascular exam+ dysrhythmias + Valvular Problems/Murmurs  Rhythm:Regular Rate:Normal  EKG 01/15/2019 NSR LBBB   Neuro/Psych CRPR type 1  Neuromuscular disease    GI/Hepatic negative GI ROS, Neg liver ROS,   Endo/Other  negative endocrine ROS  Renal/GU negative Renal ROS  negative genitourinary   Musculoskeletal  (+) Arthritis , Osteoarthritis,  Rt. Knee DJD   Abdominal (+) + obese,   Peds  Hematology   Anesthesia Other Findings   Reproductive/Obstetrics                            Anesthesia Physical Anesthesia Plan  ASA: II  Anesthesia Plan: Spinal   Post-op Pain Management:  Regional for Post-op pain   Induction:   PONV Risk Score and Plan: 4 or greater and Ondansetron, Propofol infusion, Treatment may vary due to age or medical condition and Dexamethasone  Airway Management Planned: Natural Airway, Nasal Cannula and Simple Face Mask  Additional Equipment:   Intra-op Plan:   Post-operative Plan:   Informed Consent: I have reviewed the patients History and Physical, chart, labs and discussed the procedure including the risks, benefits and alternatives for the proposed anesthesia with the patient or authorized representative who has indicated his/her understanding and acceptance.     Dental advisory given  Plan Discussed with: CRNA and  Surgeon  Anesthesia Plan Comments:        Anesthesia Quick Evaluation

## 2019-06-15 ENCOUNTER — Ambulatory Visit (HOSPITAL_COMMUNITY): Payer: Medicare Other | Admitting: Physician Assistant

## 2019-06-15 ENCOUNTER — Ambulatory Visit (HOSPITAL_COMMUNITY)
Admission: RE | Admit: 2019-06-15 | Discharge: 2019-06-15 | Disposition: A | Discharge status: Home / Self Care | Payer: Medicare Other | Attending: Orthopaedic Surgery | Admitting: Orthopaedic Surgery

## 2019-06-15 ENCOUNTER — Ambulatory Visit (HOSPITAL_COMMUNITY): Payer: Medicare Other | Admitting: Certified Registered Nurse Anesthetist

## 2019-06-15 ENCOUNTER — Encounter (HOSPITAL_COMMUNITY)
Admission: RE | Disposition: A | Discharge status: Home / Self Care | Payer: Self-pay | Source: Home / Self Care | Attending: Orthopaedic Surgery

## 2019-06-15 ENCOUNTER — Encounter (HOSPITAL_COMMUNITY): Payer: Self-pay | Admitting: Orthopaedic Surgery

## 2019-06-15 DIAGNOSIS — M19031 Primary osteoarthritis, right wrist: Secondary | ICD-10-CM | POA: Diagnosis not present

## 2019-06-15 DIAGNOSIS — E785 Hyperlipidemia, unspecified: Secondary | ICD-10-CM | POA: Insufficient documentation

## 2019-06-15 DIAGNOSIS — M1711 Unilateral primary osteoarthritis, right knee: Secondary | ICD-10-CM | POA: Diagnosis present

## 2019-06-15 DIAGNOSIS — Z87891 Personal history of nicotine dependence: Secondary | ICD-10-CM | POA: Diagnosis not present

## 2019-06-15 DIAGNOSIS — E559 Vitamin D deficiency, unspecified: Secondary | ICD-10-CM | POA: Insufficient documentation

## 2019-06-15 DIAGNOSIS — Z96652 Presence of left artificial knee joint: Secondary | ICD-10-CM | POA: Insufficient documentation

## 2019-06-15 DIAGNOSIS — Z7982 Long term (current) use of aspirin: Secondary | ICD-10-CM | POA: Insufficient documentation

## 2019-06-15 DIAGNOSIS — Z79899 Other long term (current) drug therapy: Secondary | ICD-10-CM | POA: Diagnosis not present

## 2019-06-15 HISTORY — PX: TOTAL KNEE ARTHROPLASTY: SHX125

## 2019-06-15 LAB — TYPE AND SCREEN
ABO/RH(D): A POS
Antibody Screen: NEGATIVE

## 2019-06-15 SURGERY — ARTHROPLASTY, KNEE, TOTAL
Anesthesia: Spinal | Site: Knee | Laterality: Right

## 2019-06-15 MED ORDER — 0.9 % SODIUM CHLORIDE (POUR BTL) OPTIME
TOPICAL | Status: DC | PRN
  Administered 2019-06-15: 1000 mL

## 2019-06-15 MED ORDER — ASPIRIN 81 MG PO CHEW: 81.0000 mg | CHEWABLE_TABLET | Freq: Two times a day (BID) | ORAL | 0 refills | Status: AC

## 2019-06-15 MED ORDER — MIDAZOLAM HCL 5 MG/5ML IJ SOLN
INTRAMUSCULAR | Status: DC | PRN
  Administered 2019-06-15 (×2): 1 mg via INTRAVENOUS

## 2019-06-15 MED ORDER — SODIUM CHLORIDE 0.9 % IR SOLN
Status: DC | PRN
  Administered 2019-06-15: 3000 mL

## 2019-06-15 MED ORDER — METHOCARBAMOL 500 MG IVPB - SIMPLE MED
500.0000 mg | Freq: Four times a day (QID) | INTRAVENOUS | Status: DC | PRN
  Administered 2019-06-15: 10:00:00 500 mg via INTRAVENOUS

## 2019-06-15 MED ORDER — ONDANSETRON HCL 4 MG/2ML IJ SOLN: 4.0000 mg | Freq: Once | INTRAMUSCULAR | Status: DC | PRN

## 2019-06-15 MED ORDER — STERILE WATER FOR IRRIGATION IR SOLN
Status: DC | PRN
  Administered 2019-06-15 (×2): 1000 mL

## 2019-06-15 MED ORDER — HYDROCODONE-ACETAMINOPHEN 5-325 MG PO TABS
ORAL_TABLET | ORAL | Status: AC
  Filled 2019-06-15: qty 1

## 2019-06-15 MED ORDER — PROPOFOL 500 MG/50ML IV EMUL
INTRAVENOUS | Status: DC | PRN
  Administered 2019-06-15: 50 ug/kg/min via INTRAVENOUS

## 2019-06-15 MED ORDER — TIZANIDINE HCL 4 MG PO TABS: 4.0000 mg | ORAL_TABLET | Freq: Four times a day (QID) | ORAL | 1 refills | Status: AC | PRN

## 2019-06-15 MED ORDER — SCOPOLAMINE 1 MG/3DAYS TD PT72
MEDICATED_PATCH | TRANSDERMAL | Status: DC | PRN
  Administered 2019-06-15: 1 via TRANSDERMAL

## 2019-06-15 MED ORDER — TRANEXAMIC ACID-NACL 1000-0.7 MG/100ML-% IV SOLN
1000.0000 mg | Freq: Once | INTRAVENOUS | Status: AC
  Administered 2019-06-15: 1000 mg via INTRAVENOUS

## 2019-06-15 MED ORDER — PROPOFOL 500 MG/50ML IV EMUL
INTRAVENOUS | Status: AC
  Filled 2019-06-15: qty 50

## 2019-06-15 MED ORDER — METHOCARBAMOL 500 MG PO TABS: 500.0000 mg | ORAL_TABLET | Freq: Four times a day (QID) | ORAL | Status: DC | PRN

## 2019-06-15 MED ORDER — CEFAZOLIN SODIUM-DEXTROSE 2-4 GM/100ML-% IV SOLN
2.0000 g | INTRAVENOUS | Status: AC
  Administered 2019-06-15: 2 g via INTRAVENOUS
  Filled 2019-06-15: qty 100

## 2019-06-15 MED ORDER — TRANEXAMIC ACID-NACL 1000-0.7 MG/100ML-% IV SOLN
INTRAVENOUS | Status: AC
  Filled 2019-06-15: qty 100

## 2019-06-15 MED ORDER — POVIDONE-IODINE 10 % EX SWAB
2.0000 "application " | Freq: Once | CUTANEOUS | Status: AC
  Administered 2019-06-15: 2 via TOPICAL

## 2019-06-15 MED ORDER — FENTANYL CITRATE (PF) 100 MCG/2ML IJ SOLN: 25.0000 ug | INTRAMUSCULAR | Status: DC | PRN

## 2019-06-15 MED ORDER — BUPIVACAINE LIPOSOME 1.3 % IJ SUSP
INTRAMUSCULAR | Status: DC | PRN
  Administered 2019-06-15: 20 mL

## 2019-06-15 MED ORDER — LACTATED RINGERS IV SOLN: INTRAVENOUS | Status: DC

## 2019-06-15 MED ORDER — SCOPOLAMINE 1 MG/3DAYS TD PT72
MEDICATED_PATCH | TRANSDERMAL | Status: AC
  Filled 2019-06-15: qty 1

## 2019-06-15 MED ORDER — TRANEXAMIC ACID 1000 MG/10ML IV SOLN
INTRAVENOUS | Status: DC | PRN
  Administered 2019-06-15: 2000 mg via TOPICAL

## 2019-06-15 MED ORDER — SODIUM CHLORIDE (PF) 0.9 % IJ SOLN
INTRAMUSCULAR | Status: DC | PRN
  Administered 2019-06-15: 30 mL via INTRAVENOUS

## 2019-06-15 MED ORDER — PROPOFOL 10 MG/ML IV BOLUS
INTRAVENOUS | Status: DC | PRN
  Administered 2019-06-15 (×2): 40 mg via INTRAVENOUS

## 2019-06-15 MED ORDER — HYDROCODONE-ACETAMINOPHEN 5-325 MG PO TABS: 1.0000 | ORAL_TABLET | Freq: Four times a day (QID) | ORAL | 0 refills | Status: AC | PRN

## 2019-06-15 MED ORDER — TRANEXAMIC ACID-NACL 1000-0.7 MG/100ML-% IV SOLN
1000.0000 mg | INTRAVENOUS | Status: AC
  Administered 2019-06-15: 1000 mg via INTRAVENOUS
  Filled 2019-06-15: qty 100

## 2019-06-15 MED ORDER — PHENYLEPHRINE 40 MCG/ML (10ML) SYRINGE FOR IV PUSH (FOR BLOOD PRESSURE SUPPORT)
PREFILLED_SYRINGE | INTRAVENOUS | Status: AC
  Filled 2019-06-15: qty 10

## 2019-06-15 MED ORDER — CHLORHEXIDINE GLUCONATE 4 % EX LIQD: 60.0000 mL | Freq: Once | CUTANEOUS | Status: DC

## 2019-06-15 MED ORDER — GLYCOPYRROLATE PF 0.2 MG/ML IJ SOSY: PREFILLED_SYRINGE | INTRAMUSCULAR | Status: DC | PRN

## 2019-06-15 MED ORDER — PHENYLEPHRINE HCL (PRESSORS) 10 MG/ML IV SOLN
INTRAVENOUS | Status: AC
  Filled 2019-06-15: qty 1

## 2019-06-15 MED ORDER — METHOCARBAMOL 500 MG IVPB - SIMPLE MED
INTRAVENOUS | Status: AC
  Filled 2019-06-15: qty 50

## 2019-06-15 MED ORDER — ONDANSETRON HCL 4 MG/2ML IJ SOLN
INTRAMUSCULAR | Status: AC
  Filled 2019-06-15: qty 2

## 2019-06-15 MED ORDER — MEPERIDINE HCL 50 MG/ML IJ SOLN: 6.2500 mg | INTRAMUSCULAR | Status: DC | PRN

## 2019-06-15 MED ORDER — PROPOFOL 10 MG/ML IV BOLUS
INTRAVENOUS | Status: AC
  Filled 2019-06-15: qty 20

## 2019-06-15 MED ORDER — DEXAMETHASONE SODIUM PHOSPHATE 10 MG/ML IJ SOLN
INTRAMUSCULAR | Status: DC | PRN
  Administered 2019-06-15: 10 mg via INTRAVENOUS

## 2019-06-15 MED ORDER — LACTATED RINGERS IV BOLUS
500.0000 mL | Freq: Once | INTRAVENOUS | Status: AC
  Administered 2019-06-15: 500 mL via INTRAVENOUS

## 2019-06-15 MED ORDER — BUPIVACAINE HCL (PF) 0.25 % IJ SOLN
INTRAMUSCULAR | Status: AC
  Filled 2019-06-15: qty 30

## 2019-06-15 MED ORDER — DEXAMETHASONE SODIUM PHOSPHATE 10 MG/ML IJ SOLN
INTRAMUSCULAR | Status: AC
  Filled 2019-06-15: qty 1

## 2019-06-15 MED ORDER — ROPIVACAINE HCL 7.5 MG/ML IJ SOLN
INTRAMUSCULAR | Status: DC | PRN
  Administered 2019-06-15: 20 mL via PERINEURAL

## 2019-06-15 MED ORDER — LACTATED RINGERS IV BOLUS
250.0000 mL | Freq: Once | INTRAVENOUS | Status: AC
  Administered 2019-06-15: 250 mL via INTRAVENOUS

## 2019-06-15 MED ORDER — FENTANYL CITRATE (PF) 100 MCG/2ML IJ SOLN
INTRAMUSCULAR | Status: AC
  Filled 2019-06-15: qty 2

## 2019-06-15 MED ORDER — CEFAZOLIN SODIUM-DEXTROSE 2-4 GM/100ML-% IV SOLN: 2.0000 g | Freq: Four times a day (QID) | INTRAVENOUS | Status: DC

## 2019-06-15 MED ORDER — FENTANYL CITRATE (PF) 100 MCG/2ML IJ SOLN
INTRAMUSCULAR | Status: DC | PRN
  Administered 2019-06-15 (×2): 50 ug via INTRAVENOUS

## 2019-06-15 MED ORDER — BUPIVACAINE IN DEXTROSE 0.75-8.25 % IT SOLN
INTRATHECAL | Status: DC | PRN
  Administered 2019-06-15: 1.6 mL via INTRATHECAL

## 2019-06-15 MED ORDER — ONDANSETRON HCL 4 MG/2ML IJ SOLN
INTRAMUSCULAR | Status: DC | PRN
  Administered 2019-06-15: 4 mg via INTRAVENOUS

## 2019-06-15 MED ORDER — BUPIVACAINE HCL (PF) 0.25 % IJ SOLN
INTRAMUSCULAR | Status: DC | PRN
  Administered 2019-06-15: 30 mL

## 2019-06-15 MED ORDER — SODIUM CHLORIDE (PF) 0.9 % IJ SOLN
INTRAMUSCULAR | Status: AC
  Filled 2019-06-15: qty 50

## 2019-06-15 MED ORDER — HYDROCODONE-ACETAMINOPHEN 5-325 MG PO TABS
1.0000 | ORAL_TABLET | ORAL | Status: DC | PRN
  Administered 2019-06-15: 1 via ORAL

## 2019-06-15 MED ORDER — MIDAZOLAM HCL 2 MG/2ML IJ SOLN
INTRAMUSCULAR | Status: AC
  Filled 2019-06-15: qty 2

## 2019-06-15 SURGICAL SUPPLY — 49 items
ATTUNE MED DOME PAT 38 KNEE (Knees) ×2 IMPLANT
ATTUNE PS FEM RT SZ 5 CEM KNEE (Femur) ×2 IMPLANT
ATTUNE PSRP INSR SZ5 5 KNEE (Insert) ×2 IMPLANT
BAG DECANTER FOR FLEXI CONT (MISCELLANEOUS) ×2 IMPLANT
BAG ZIPLOCK 12X15 (MISCELLANEOUS) ×2 IMPLANT
BASE TIBIA ATTUNE KNEE SYS SZ6 (Knees) ×1 IMPLANT
BLADE SAGITTAL 25.0X1.19X90 (BLADE) ×2 IMPLANT
BLADE SAW SGTL 11.0X1.19X90.0M (BLADE) ×2 IMPLANT
BNDG ELASTIC 6X5.8 VLCR STR LF (GAUZE/BANDAGES/DRESSINGS) ×2 IMPLANT
BOOTIES KNEE HIGH SLOAN (MISCELLANEOUS) ×2 IMPLANT
BOWL SMART MIX CTS (DISPOSABLE) ×2 IMPLANT
CEMENT HV SMART SET (Cement) ×4 IMPLANT
COVER SURGICAL LIGHT HANDLE (MISCELLANEOUS) ×2 IMPLANT
COVER WAND RF STERILE (DRAPES) ×2 IMPLANT
CUFF TOURN SGL QUICK 34 (TOURNIQUET CUFF) ×1
CUFF TRNQT CYL 34X4.125X (TOURNIQUET CUFF) ×1 IMPLANT
DECANTER SPIKE VIAL GLASS SM (MISCELLANEOUS) ×4 IMPLANT
DRAPE SHEET LG 3/4 BI-LAMINATE (DRAPES) ×2 IMPLANT
DRAPE TOP 10253 STERILE (DRAPES) ×2 IMPLANT
DRAPE U-SHAPE 47X51 STRL (DRAPES) ×2 IMPLANT
DRSG AQUACEL AG ADV 3.5X10 (GAUZE/BANDAGES/DRESSINGS) ×2 IMPLANT
DURAPREP 26ML APPLICATOR (WOUND CARE) ×4 IMPLANT
ELECT REM PT RETURN 15FT ADLT (MISCELLANEOUS) ×2 IMPLANT
GLOVE BIO SURGEON STRL SZ8 (GLOVE) ×4 IMPLANT
GLOVE BIOGEL PI IND STRL 8 (GLOVE) ×2 IMPLANT
GLOVE BIOGEL PI INDICATOR 8 (GLOVE) ×2
GOWN STRL REUS W/TWL XL LVL3 (GOWN DISPOSABLE) ×4 IMPLANT
HANDPIECE INTERPULSE COAX TIP (DISPOSABLE) ×1
HOLDER FOLEY CATH W/STRAP (MISCELLANEOUS) IMPLANT
HOOD PEEL AWAY FLYTE STAYCOOL (MISCELLANEOUS) ×6 IMPLANT
KIT TURNOVER KIT A (KITS) IMPLANT
MANIFOLD NEPTUNE II (INSTRUMENTS) ×2 IMPLANT
NS IRRIG 1000ML POUR BTL (IV SOLUTION) ×2 IMPLANT
PACK TOTAL KNEE CUSTOM (KITS) ×2 IMPLANT
PAD ARMBOARD 7.5X6 YLW CONV (MISCELLANEOUS) ×2 IMPLANT
PENCIL SMOKE EVACUATOR (MISCELLANEOUS) IMPLANT
PIN DRILL FIX HALF THREAD (BIT) ×2 IMPLANT
PIN STEINMAN FIXATION KNEE (PIN) ×2 IMPLANT
PROTECTOR NERVE ULNAR (MISCELLANEOUS) ×2 IMPLANT
SET HNDPC FAN SPRY TIP SCT (DISPOSABLE) ×1 IMPLANT
SUT ETHIBOND NAB CT1 #1 30IN (SUTURE) ×4 IMPLANT
SUT VIC AB 0 CT1 36 (SUTURE) ×2 IMPLANT
SUT VIC AB 2-0 CT1 27 (SUTURE) ×1
SUT VIC AB 2-0 CT1 TAPERPNT 27 (SUTURE) ×1 IMPLANT
SUT VICRYL AB 3-0 FS1 BRD 27IN (SUTURE) ×2 IMPLANT
TIBIA ATTUNE KNEE SYS BASE SZ6 (Knees) ×2 IMPLANT
TRAY FOLEY MTR SLVR 16FR STAT (SET/KITS/TRAYS/PACK) IMPLANT
WATER STERILE IRR 1000ML POUR (IV SOLUTION) ×2 IMPLANT
WRAP KNEE MAXI GEL POST OP (GAUZE/BANDAGES/DRESSINGS) ×2 IMPLANT

## 2019-06-15 NOTE — Transfer of Care (Signed)
Immediate Anesthesia Transfer of Care Note  Patient: Erica Stanley  Procedure(s) Performed: RIGHT TOTAL KNEE ARTHROPLASTY (Right Knee)  Patient Location: PACU  Anesthesia Type:Spinal  Level of Consciousness: awake, alert  and oriented  Airway & Oxygen Therapy: Patient Spontanous Breathing and Patient connected to face mask oxygen  Post-op Assessment: Report given to RN and Post -op Vital signs reviewed and stable  Post vital signs: Reviewed and stable  Last Vitals:  Vitals Value Taken Time  BP    Temp    Pulse 68 06/15/19 0938  Resp 13 06/15/19 0938  SpO2 100 % 06/15/19 0938  Vitals shown include unvalidated device data.  Last Pain:  Vitals:   06/15/19 0553  TempSrc:   PainSc: 0-No pain         Complications: No apparent anesthesia complications

## 2019-06-15 NOTE — Interval H&P Note (Signed)
History and Physical Interval Note:  06/15/2019 7:30 AM  Erica Stanley  has presented today for surgery, with the diagnosis of RIGHT KNEE DEGENERATIVE JOINT DISEASE.  The various methods of treatment have been discussed with the patient and family. After consideration of risks, benefits and other options for treatment, the patient has consented to  Procedure(s): RIGHT TOTAL KNEE ARTHROPLASTY (Right) as a surgical intervention.  The patient's history has been reviewed, patient examined, no change in status, stable for surgery.  I have reviewed the patient's chart and labs.  Questions were answered to the patient's satisfaction.     [name]

## 2019-06-15 NOTE — Anesthesia Procedure Notes (Signed)
Anesthesia Regional Block: Adductor canal block   Pre-Anesthetic Checklist: ,, timeout performed, Correct Patient, Correct Site, Correct Laterality, Correct Procedure, Correct Position, site marked, Risks and benefits discussed,  Surgical consent,  Pre-op evaluation,  At surgeon's request and post-op pain management  Laterality: Right  Prep: chloraprep       Needles:  Injection technique: Single-shot  Needle Type: Echogenic Stimulator Needle     Needle Length: 9cm  Needle Gauge: 21   Needle insertion depth: 8 cm   Additional Needles:   Procedures:,,,, ultrasound used (permanent image in chart),,,,  Narrative:  Start time: 06/15/2019 7:05 AM End time: 06/15/2019 7:10 AM Injection made incrementally with aspirations every 5 mL.  Performed by: Personally  Anesthesiologist: Mal Amabile, MD  Additional Notes: Timeout performed. Patient sedated. Relevant anatomy ID'd using [place]. Incremental 2-30ml injection of LA with frequent aspiration. Patient tolerated procedure well.        Right Adductor Canal Block

## 2019-06-15 NOTE — OR Nursing (Signed)
Patient straight cath at end of surgery. clear yellow urine

## 2019-06-15 NOTE — Anesthesia Procedure Notes (Signed)
Spinal  Start time: 06/15/2019 7:37 AM End time: 06/15/2019 7:41 AM Staffing Performed: resident/CRNA  Anesthesiologist: Mal Amabile, MD Resident/CRNA: Epimenio Sarin, CRNA Preanesthetic Checklist Completed: patient identified, IV checked, risks and benefits discussed, surgical consent, monitors and equipment checked, pre-op evaluation and timeout performed Spinal Block Patient position: sitting Prep: DuraPrep Patient monitoring: heart rate, cardiac monitor, continuous pulse ox and blood pressure Approach: midline Location: L3-4 Injection technique: single-shot Needle Needle type: Pencan  Needle gauge: 24 G Needle length: 10 cm Needle insertion depth: 7 cm Assessment Sensory level: T6

## 2019-06-15 NOTE — Evaluation (Signed)
Physical Therapy Evaluation Patient Details Name: Erica Stanley MRN: [id number] DOB: 20-Jan-1951 Today's Date: 06/15/2019   History of Present Illness  Patient is 69 y.o. female s/p Rt TKA on 06/15/19 with PMH significnat for HTN, HLD, OA, CRPS, and Lt TKA on 01/19/19.  Clinical Impression  Iridian Gildner is a 69 y.o. female POD 0 s/p Rt TKA. Patient reports independence with mobility at baseline. Patient is now limited by functional impairments (see PT problem list below) and requires supervision for transfers and gait with RW. Patient was able to ambulate ~100 feet with RW and supervision and cues for safe walker management.  Patient instructed in exercises to facilitate ROM and circulation. Patient required use of Knee Immobilizer for all mobility due to poor Rt quad motor control and was educated on on how to don/doff immobilizer and importance of use to prevent knee buckling. Pt was educated to continue use of immobilizer until she can perform SLR with Rt LE. Patient will benefit from continued skilled PT interventions to address impairments and progress towards PLOF. Patient has met mobility goals at adequate level for discharge home; will continue to follow if pt continues acute stay to progress towards Mod I goals.     Follow Up Recommendations Follow surgeon's recommendation for DC plan and follow-up therapies    Equipment Recommendations  None recommended by PT    Recommendations for Other Services       Precautions / Restrictions Precautions Precautions: Fall Required Braces or Orthoses: Knee Immobilizer - Right Knee Immobilizer - Right: Discontinue once straight leg raise with < 10 degree lag Restrictions Weight Bearing Restrictions: No      Mobility  Bed Mobility Overal bed mobility: Needs Assistance Bed Mobility: Sit to Supine;Supine to Sit     Supine to sit: Supervision Sit to supine: Supervision   General bed mobility comments: verbal cues for use of Lt LE to assist Rt  LE mobility to bring LE off EOB and raise it back on. educated pt on how to don/doff knee immobilizer in supine position for safety to prevent knee buckling in standing.  Transfers Overall transfer level: Needs assistance Equipment used: Rolling walker (2 wheeled) Transfers: Sit to/from [facility] Sit to Stand: Supervision;Min guard Stand pivot transfers: Min assist;Min guard       General transfer comment: cues for safe sequencing the hand placement and technique with RW, no assist required for power up. pt required min assist for stand pivot to [facility] as knee immobilzer no longer in place as pt needed to change clothes.  Ambulation/Gait Ambulation/Gait assistance: Min guard;Supervision Gait Distance (Feet): 100 Feet Assistive device: Rolling walker (2 wheeled) Gait Pattern/deviations: Wide base of support;Decreased stride length;Decreased stance time - right;Decreased step length - left;Step-to pattern Gait velocity: decreased   General Gait Details: pt with knee immobilizer on Rt LE. no overt LOB and pt demonstrated good use of UE's to support her drign Rt stance. verbal cues for safe proximity throughout.  Stairs Stairs: (no stairs at home)          Wheelchair Mobility    Modified Rankin (Stroke Patients Only)       Balance Overall balance assessment: Needs assistance   Sitting balance-Leahy Scale: Good     Standing balance support: During functional activity;Bilateral upper extremity supported Standing balance-Leahy Scale: Poor                Pertinent Vitals/Pain Pain Assessment: Faces Faces Pain Scale: Hurts a little bit Pain Location: Rt knee  Pain Descriptors / Indicators: Aching;Tiring;Sore Pain Intervention(s): Monitored during session;Limited activity within patient's tolerance    Home Living Family/patient expects to be discharged to:: Private residence Living Arrangements: Non-relatives/Friends(pt has a roommate who can assist  her) Available Help at Discharge: Friend(s);Available 24 hours/day Type of Home: Apartment Home Access: Level entry     Home Layout: One level Home Equipment: Toilet riser;Cane - single point;Walker - 2 wheels;Bedside commode      Prior Function Level of Independence: Independent               Hand Dominance   Dominant Hand: Right    Extremity/Trunk Assessment   Upper Extremity Assessment Upper Extremity Assessment: Overall WFL for tasks assessed    Lower Extremity Assessment Lower Extremity Assessment: RLE deficits/detail RLE Deficits / Details: pt with 1/5 for quad strength/activation in Rt LE, unable to perform SLR, SAQ, or LAQ RLE: Unable to fully assess due to immobilization RLE Sensation: WNL RLE Coordination: decreased gross motor    Cervical / Trunk Assessment Cervical / Trunk Assessment: Normal  Communication   Communication: No difficulties  Cognition Arousal/Alertness: Awake/alert Behavior During Therapy: WFL for tasks assessed/performed Overall Cognitive Status: Within Functional Limits for tasks assessed              General Comments      Exercises Total Joint Exercises Ankle Circles/Pumps: AROM;Supine;Both;20 reps;Seated Quad Sets: AROM Short Arc Quad: (unable to perform) Heel Slides: AAROM;5 reps;Supine;Right Hip ABduction/ADduction: AROM;5 reps;Supine;Right Straight Leg Raises: (unable to perform) Long Arc Quad: (unable to perform)   Assessment/Plan    PT Assessment Patient needs continued PT services  PT Problem List Decreased range of motion;Decreased strength;Decreased activity tolerance;Decreased balance;Decreased mobility;Decreased knowledge of use of DME       PT Treatment Interventions DME instruction;Functional mobility training;Balance training;Patient/family education;Therapeutic activities;Gait training;Stair training;Therapeutic exercise    PT Goals (Current goals can be found in the Care Plan section)  Acute Rehab PT  Goals Patient Stated Goal: to get home safely PT Goal Formulation: With patient Time For Goal Achievement: 06/22/19 Potential to Achieve Goals: Good    Frequency 7X/week    AM-PAC PT "6 Clicks" Mobility  Outcome Measure Help needed turning from your back to your side while in a flat bed without using bedrails?: A Little Help needed moving from lying on your back to sitting on the side of a flat bed without using bedrails?: A Little Help needed moving to and from a bed to a chair (including a wheelchair)?: A Little Help needed standing up from a chair using your arms (e.g., wheelchair or bedside chair)?: A Little Help needed to walk in hospital room?: A Little Help needed climbing 3-5 steps with a railing? : A Little 6 Click Score: 18    End of Session Equipment Utilized During Treatment: Gait belt;Right knee immobilizer Activity Tolerance: Patient tolerated treatment well Patient left: in chair;with call bell/phone within reach Nurse Communication: Mobility status PT Visit Diagnosis: Muscle weakness (generalized) (M62.81);Difficulty in walking, not elsewhere classified (R26.2)    Time: 0149-9692 PT Time Calculation (min) (ACUTE ONLY): 63 min   Charges:   PT Evaluation $PT Eval Low Complexity: 1 Low PT Treatments $Gait Training: 8-22 mins $Therapeutic Exercise: 8-22 mins $Therapeutic Activity: 8-22 mins       Verner Mould, DPT Physical Therapist with [facility] [phone]  06/15/2019 3:31 PM

## 2019-06-15 NOTE — Op Note (Signed)
PREOP DIAGNOSIS: DJD RIGHT KNEE POSTOP DIAGNOSIS: same PROCEDURE: RIGHT TKR ANESTHESIA: Spinal and MAC ATTENDING SURGEON: [name] ASSISTANT: Erica Dolly PA  INDICATIONS FOR PROCEDURE: Erica Stanley is a 69 y.o. female who has struggled for a long time with pain due to degenerative arthritis of the right knee.  The patient has failed many conservative non-operative measures and at this point has pain which limits the ability to sleep and walk.  The patient is offered total knee replacement.  Informed operative consent was obtained after discussion of possible risks of anesthesia, infection, neurovascular injury, DVT, and death.  The importance of the post-operative rehabilitation protocol to optimize result was stressed extensively with the patient.  SUMMARY OF FINDINGS AND PROCEDURE:  Erica Stanley was taken to the operative suite where under the above anesthesia a right knee replacement was performed.  There were advanced degenerative changes and the bone quality was fair.  We used the DePuy Attune system and placed size 5 femur, 6 tibia, 38 mm all polyethylene patella, and a size 5 mm spacer.  Erica Dolly PA-C assisted throughout and was invaluable to the completion of the case in that he helped retract and maintain exposure while I placed components.  He also helped close thereby minimizing OR time.  The patient was admitted for appropriate post-op care to include perioperative antibiotics and mechanical and pharmacologic measures for DVT prophylaxis.  DESCRIPTION OF PROCEDURE:  Erica Stanley was taken to the operative suite where the above anesthesia was applied.  The patient was positioned supine and prepped and draped in normal sterile fashion.  An appropriate time out was performed.  After the administration of kefzol pre-op antibiotic the leg was elevated and exsanguinated and a tourniquet inflated. A standard longitudinal incision was made on the anterior knee.  Dissection was carried  down to the extensor mechanism.  All appropriate anti-infective measures were used including the pre-operative antibiotic, betadine impregnated drape, and closed hooded exhaust systems for each member of the surgical team.  A medial parapatellar incision was made in the extensor mechanism and the knee cap flipped and the knee flexed.  Some residual meniscal tissues were removed along with any remaining ACL/PCL tissue.  A guide was placed on the tibia and a flat cut was made on it's superior surface.  An intramedullary guide was placed in the femur and was utilized to make anterior and posterior cuts creating an appropriate flexion gap.  A second intramedullary guide was placed in the femur to make a distal cut properly balancing the knee with an extension gap equal to the flexion gap.  The three bones sized to the above mentioned sizes and the appropriate guides were placed and utilized.  A trial reduction was done and the knee easily came to full extension and the patella tracked well on flexion.  The trial components were removed and all bones were cleaned with pulsatile lavage and then dried thoroughly.  Cement was mixed and was pressurized onto the bones followed by placement of the aforementioned components.  Excess cement was trimmed and pressure was held on the components until the cement had hardened.  The tourniquet was deflated and a small amount of bleeding was controlled with cautery and pressure.  The knee was irrigated thoroughly.  The extensor mechanism was re-approximated with #1 ethibond in interrupted fashion.  The knee was flexed and the repair was solid.  The subcutaneous tissues were re-approximated with #0 and #2-0 vicryl and the skin closed with a subcuticular stitch  and steristrips.  A sterile dressing was applied.  Intraoperative fluids, EBL, and tourniquet time can be obtained from anesthesia records.  DISPOSITION:  The patient was taken to recovery room in stable condition and admitted  for appropriate post-op care to include peri-operative antibiotic and DVT prophylaxis with mechanical and pharmacologic measures.  Monico Blitz Myriah Boggus 06/15/2019, 9:05 AM

## 2019-06-15 NOTE — Anesthesia Postprocedure Evaluation (Signed)
Anesthesia Post Note  Patient: Librarian, academic  Procedure(s) Performed: RIGHT TOTAL KNEE ARTHROPLASTY (Right Knee)     Patient location during evaluation: PACU Anesthesia Type: Spinal Level of consciousness: oriented and awake and alert Pain management: pain level controlled Vital Signs Assessment: post-procedure vital signs reviewed and stable Respiratory status: spontaneous breathing, respiratory function stable and nonlabored ventilation Cardiovascular status: blood pressure returned to baseline and stable Postop Assessment: no headache, no backache, no apparent nausea or vomiting, spinal receding and patient able to bend at knees Anesthetic complications: no    Last Vitals:  Vitals:   06/15/19 0945 06/15/19 1000  BP: 130/61 (!) 150/83  Pulse: 71 70  Resp: 15 13  Temp:    SpO2: 98% 97%    Last Pain:  Vitals:   06/15/19 1000  TempSrc:   PainSc: 0-No pain    LLE Motor Response: Purposeful movement;Responds to commands (06/15/19 1000) LLE Sensation: No numbness;No pain;No tingling (06/15/19 1000) RLE Motor Response: Purposeful movement;Responds to commands (06/15/19 1000) RLE Sensation: Tingling (06/15/19 1000) L Sensory Level: S1-Sole of foot, small toes (06/15/19 1000) R Sensory Level: S1-Sole of foot, small toes (06/15/19 1000)  Ousman Dise A.

## 2019-06-16 ENCOUNTER — Encounter: Payer: Self-pay | Admitting: *Deleted

## 2019-08-06 ENCOUNTER — Ambulatory Visit: Payer: Medicare Other | Attending: Internal Medicine

## 2019-08-06 DIAGNOSIS — Z23 Encounter for immunization: Secondary | ICD-10-CM | POA: Insufficient documentation

## 2019-08-31 ENCOUNTER — Ambulatory Visit: Payer: Medicare Other | Attending: Internal Medicine

## 2019-08-31 DIAGNOSIS — Z23 Encounter for immunization: Secondary | ICD-10-CM

## 2019-08-31 NOTE — Progress Notes (Signed)
   Covid-19 Vaccination Clinic  Name:  Erica Stanley    MRN: [id number] DOB: 1951/01/18  08/31/2019  [name] was observed post Covid-19 immunization for 15 minutes without incident. She was provided with Vaccine Information Sheet and instruction to access the V-Safe system.   [name] was instructed to call 911 with any severe reactions post vaccine: Marland Kitchen Difficulty breathing  . Swelling of face and throat  . A fast heartbeat  . A bad rash all over body  . Dizziness and weakness   Immunizations Administered    Name Date Dose VIS Date Route   Pfizer COVID-19 Vaccine 08/31/2019 12:39 PM 0.3 mL 05/21/2019 Intramuscular   Manufacturer: [employer], [employer]   Lot: [id number]   NDC: [address]-2

## 2020-04-03 ENCOUNTER — Other Ambulatory Visit: Payer: Self-pay | Admitting: Internal Medicine

## 2020-04-03 DIAGNOSIS — E2839 Other primary ovarian failure: Secondary | ICD-10-CM

## 2020-05-18 ENCOUNTER — Ambulatory Visit (INDEPENDENT_AMBULATORY_CARE_PROVIDER_SITE_OTHER): Payer: Medicare Other | Admitting: Plastic Surgery

## 2020-05-18 ENCOUNTER — Other Ambulatory Visit: Payer: Self-pay

## 2020-05-18 VITALS — BP 141/80 | HR 84 | Temp 98.3°F | Ht 66.5 in | Wt 194.0 lb

## 2020-05-18 DIAGNOSIS — M793 Panniculitis, unspecified: Secondary | ICD-10-CM

## 2020-05-18 DIAGNOSIS — Z411 Encounter for cosmetic surgery: Secondary | ICD-10-CM

## 2020-05-18 NOTE — Progress Notes (Signed)
Referring Provider Andi Devon, MD [address] [place],  [place] [address]   CC:  Chief Complaint  Patient presents with  . Advice Only      Erica Stanley is an 69 y.o. female.  HPI: Patient presents to discuss her abdomen.  She is bothered by the overhanging skin.  She gets rashes beneath the crease that have been refractory to over-the-counter treatments.  She is concerned with getting infection.  Her only previous abdominal surgeries are hysterectomy and oophorectomy done in the 90s.  She does not smoke and is not diabetic.  She is lost about 10 to 15 pounds over the past 10 to 15 years so her weight is largely stable.  Allergies  Allergen Reactions  . Other Itching    Pt is allergic to polyester; synthetic materials spandex unable to wear ted hoses  . Codeine Nausea And Vomiting  . Hydrocortisone Hives  . Menthol Hives, Itching, Nausea And Vomiting and Swelling  . Morphine And Related Nausea And Vomiting    Outpatient Encounter Medications as of 05/18/2020  Medication Sig  . Ascorbic Acid (VITAMIN C) 1000 MG tablet Take 1,000 mg by mouth 3 (three) times daily.  Marland Kitchen aspirin 81 MG chewable tablet Chew 1 tablet (81 mg total) by mouth 2 (two) times daily.  . Cholecalciferol (VITAMIN D) 125 MCG (5000 UT) CAPS Take 5,000 Units by mouth daily.  . clotrimazole-betamethasone (LOTRISONE) cream Apply 1 application topically 2 (two) times daily as needed for irritation.  Marland Kitchen ELDERBERRY PO Take 1 capsule by mouth 2 (two) times daily.  . Homeopathic Products (SIMILASAN DRY EYE RELIEF OP) Place 1 drop into both eyes 2 (two) times daily as needed (dry eyes).  . Homeopathic Products [employer] NASAL ALLERGY RELIEF NA) Place 2 sprays into both nostrils daily as needed (allergies).  Marland Kitchen HYDROcodone-acetaminophen (NORCO/VICODIN) 5-325 MG tablet Take 1-2 tablets by mouth every 6 (six) hours as needed for moderate pain. (Patient not taking: Reported on 05/18/2020)  . lactobacillus  acidophilus (BACID) TABS tablet Take 2 tablets by mouth 3 (three) times daily.  . Multiple Minerals (CALCIUM-MAGNESIUM-ZINC) TABS Take 1 tablet by mouth 3 (three) times daily.  . Multiple Vitamin (MULTIVITAMIN WITH MINERALS) TABS tablet Take 1 tablet by mouth daily.  . ondansetron (ZOFRAN) 4 MG tablet Take 1 tablet (4 mg total) by mouth every 6 (six) hours as needed for nausea. (Patient not taking: Reported on 05/18/2020)  . PANCREATIN PO Take 350 mg by mouth 3 (three) times daily.  . Potassium 99 MG TABS Take 99 mg by mouth 3 (three) times daily.  Marland Kitchen tiZANidine (ZANAFLEX) 4 MG tablet Take 1 tablet (4 mg total) by mouth every 6 (six) hours as needed. (Patient not taking: Reported on 05/18/2020)  . TURMERIC PO Take 900 mg by mouth 3 (three) times daily.   No facility-administered encounter medications on file as of 05/18/2020.     Past Medical History:  Diagnosis Date  . Allergic rhinitis   . Arthritis    right wrist  . Complication of anesthesia   . CRPS (complex regional pain syndrome type I)   . DJD (degenerative joint disease) of knee   . Dysrhythmia     Lt BBB  . Heart murmur    childhood, out grew  . History of iron deficiency anemia    during menstrual cycles  . Hyperlipidemia   . Pneumonia 1998   bacterial  . PONV (postoperative nausea and vomiting)   . Pre-diabetes  history of, has lost weight  . Vitamin D deficiency     Past Surgical History:  Procedure Laterality Date  . ABDOMINAL HYSTERECTOMY  1994  . BILATERAL OOPHORECTOMY    . CLOSED REDUCTION TOE FRACTURE Left 1968  . MANDIBLE RECONSTRUCTION  1989  . TONSILLECTOMY  1959  . TOTAL KNEE ARTHROPLASTY Left 01/19/2019   Procedure: Left Total Knee Arthroplasty;  Surgeon: Marcene Corning, MD;  Location: [facility];  Service: Orthopedics;  Laterality: Left;  . TOTAL KNEE ARTHROPLASTY Right 06/15/2019   Procedure: RIGHT TOTAL KNEE ARTHROPLASTY;  Surgeon: Marcene Corning, MD;  Location: [facility];  Service: Orthopedics;   Laterality: Right;  . WRIST SURGERY Right     No family history on file.  Social History   Social History Narrative  . Not on file     Review of Systems General: Denies fevers, chills, weight loss CV: Denies chest pain, shortness of breath, palpitations  Physical Exam Vitals with BMI 05/18/2020 06/15/2019 06/15/2019  Height 5' 6.5" - -  Weight 194 lbs - -  BMI 30.85 - -  Systolic 141 148 914  Diastolic 80 79 98  Pulse 84 - 79    General:  No acute distress,  Alert and oriented, Non-Toxic, Normal speech and affect Abdomen: Abdomen is soft nontender.  I do not appreciate any hernias.  She has a lower transverse scar that is nicely healed.  She does have an overhanging infraumbilical pannus with signs of skin irritation beneath the crease.  She also has another point of tethering at the level of the umbilicus but no real overhanging in the upper abdominal area.  She does have fairly significant skin excess and moderate adipose tissue asked excess.  Assessment/Plan Patient is a good candidate for abdominal contouring.  We discussed infraumbilical panniculectomy versus abdominoplasty.  I think she is a good candidate for both.  We discussed that infraumbilical panniculectomy would address the infraumbilical area but with largely not address the supraumbilical access.  Abdominoplasty could be combined with liposuction and give a nice her overall contour if that was important to her.  I explained the risks include bleeding, infection, damage to surrounding structures and need for additional procedures.  We discussed the expectations postoperatively and the need for drains and compressive garments.  All her questions were answered and we will plan to move forward.  Allena Napoleon 05/18/2020, 9:47 AM

## 2020-05-24 ENCOUNTER — Telehealth: Payer: Self-pay

## 2020-05-24 NOTE — Telephone Encounter (Signed)
Patient called to say she has another area in her groin area where she is starting to experience chafing.  Patient states that yesterday is the second time she had to apply salve.  She would like to speak with someone about this.  Please call.

## 2020-05-25 ENCOUNTER — Telehealth: Payer: Self-pay

## 2020-05-25 NOTE — Telephone Encounter (Signed)
Call to pt- no answer/left voicemail requesting call back 

## 2020-05-26 ENCOUNTER — Telehealth: Payer: Self-pay

## 2020-05-26 NOTE — Telephone Encounter (Signed)
Call to pt regarding the following concerns: Pt reports that she has experienced an increase in "chaffing/irritation " lower/abdonmen/pubic area- left side more than right.  She states she has had to use OTC salve ( MG-12) multiple times for relief of symptoms. She denies any open wound/areas- but very sensitive/pain associated with the rash. I informed pt that she would need to have her PCP or other MD make a record of this rash/symptoms & forward it to our office via fax- so [facility] - scheduler/insurance rep- could submit the documentation for prior approval. Pt will call her PCP- Dr. Kellie Shropshire: address- [address] [place], [place] [address]  / ph # [phone].

## 2020-05-31 ENCOUNTER — Telehealth: Payer: Self-pay

## 2020-05-31 NOTE — Telephone Encounter (Signed)
Call to Dr. Mathews Robinsons office -to obtain records for pt's visit on 09/27/19. Her office forwarded these records & I scanned them to [facility] ( surgery coordinator) via her e-mail. I entered a copy to be scanned into pt's chart

## 2020-07-06 DIAGNOSIS — Z719 Counseling, unspecified: Secondary | ICD-10-CM

## 2020-08-09 ENCOUNTER — Telehealth (INDEPENDENT_AMBULATORY_CARE_PROVIDER_SITE_OTHER): Payer: Medicare Other | Admitting: Surgical

## 2020-08-09 ENCOUNTER — Other Ambulatory Visit: Payer: Self-pay

## 2020-08-09 ENCOUNTER — Telehealth: Payer: Self-pay | Admitting: Surgical

## 2020-08-09 ENCOUNTER — Encounter: Payer: Self-pay | Admitting: Surgical

## 2020-08-09 DIAGNOSIS — Z719 Counseling, unspecified: Secondary | ICD-10-CM

## 2020-08-09 DIAGNOSIS — Z411 Encounter for cosmetic surgery: Secondary | ICD-10-CM

## 2020-08-09 DIAGNOSIS — M793 Panniculitis, unspecified: Secondary | ICD-10-CM

## 2020-08-09 MED ORDER — ONDANSETRON HCL 4 MG PO TABS: 4.0000 mg | ORAL_TABLET | Freq: Three times a day (TID) | ORAL | 0 refills | Status: DC | PRN

## 2020-08-09 MED ORDER — ONDANSETRON HCL 4 MG PO TABS: 4.0000 mg | ORAL_TABLET | Freq: Three times a day (TID) | ORAL | 0 refills | Status: AC | PRN

## 2020-08-09 MED ORDER — HYDROCODONE-ACETAMINOPHEN 5-325 MG PO TABS: 1.0000 | ORAL_TABLET | Freq: Four times a day (QID) | ORAL | 0 refills | Status: AC | PRN

## 2020-08-09 MED ORDER — HYDROCODONE-ACETAMINOPHEN 5-325 MG PO TABS: 1.0000 | ORAL_TABLET | Freq: Four times a day (QID) | ORAL | 0 refills | Status: DC | PRN

## 2020-08-09 NOTE — Telephone Encounter (Signed)
Patient found out that [employer] is no longer accepted at [facility] where her prescriptions were called into originally. She would like to have them transferred over to [facility] [address] [place].

## 2020-08-09 NOTE — Progress Notes (Signed)
Patient ID: Erica Stanley, female    DOB: [birth year], 70 y.o.   MRN: [id number]  Chief Complaint  Patient presents with  . Pre-op Exam      ICD-10-CM   1. Panniculitis  M79.3   2. Encounter for cosmetic procedure  Z41.1     History of Present Illness: Erica Stanley is a 70 y.o.  female  with a history of overhanging abdominal skin.  She presents for preoperative evaluation for upcoming procedure, infraumbilical panniculectomy with upper abdomen liposuction and plication of abdominal wall, scheduled for 08/25/20 with Dr. Arita Miss.  No history of DVT/PE.  No family history of DVT/PE.  No family or personal history of bleeding or clotting disorders.  Patient is not currently taking any blood thinners.  No history of CVA/MI.   Patient reports a history of postoperative nausea vomiting, reports she does well if she uses the scopolamine patch and Zofran postoperatively.  Summary of Previous Visit: Patient is bothered by the overhanging skin.  She gets rashes beneath the crease that have been refractory to over-the-counter treatments.  Her only previous abdominal surgeries are hysterectomy and oophorectomy in the 90s.  She does not smoke and is not a diabetic.  She has lost about 10 to 15 pounds over the past 10 to 15 years so her weight is largely stable.   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  Patient is a prediabetic, most recent A1c 03/29/2020 was 5.8.  Patient is currently taking ASA 81 mg daily.  The patient gave consent to have this visit done by telemedicine / virtual visit, two identifiers were used to identify patient. This is also consent for access the chart and treat the patient via this visit. The patient is located at home.  I, the provider, am at the office.  We spent 23 minutes together for the visit.   Joined by telephone.  Patient reports no recent fevers, chills, nausea, vomiting.  No chest pain or shortness of breath.  Past Medical History: Allergies: Allergies  Allergen Reactions  . Other Itching    Pt is allergic to polyester; synthetic materials spandex unable to wear ted hoses  . Codeine Nausea And Vomiting  . Hydrocortisone Hives  . Menthol Hives, Itching, Nausea And Vomiting and Swelling  . Morphine And Related Nausea And Vomiting    Current Medications:  Current Outpatient Medications:  .  HYDROcodone-acetaminophen (NORCO) 5-325 MG tablet, Take 1 tablet by mouth every 6 (six) hours as needed for up to 5 days for severe pain., Disp: 20 tablet, Rfl: 0 .  ondansetron (ZOFRAN) 4 MG tablet, Take 1 tablet (4 mg total) by mouth every 8 (eight) hours as needed for nausea or vomiting., Disp: 20 tablet, Rfl: 0 .  Ascorbic Acid (VITAMIN C) 1000 MG tablet, Take 1,000 mg by mouth 3 (three) times daily., Disp: , Rfl:  .  aspirin 81 MG chewable tablet, Chew 1 tablet (81 mg total) by mouth 2 (two) times daily., Disp: 30 tablet, Rfl: 0 .  Cholecalciferol (VITAMIN D) 125 MCG (5000 UT) CAPS, Take 5,000 Units by mouth daily., Disp: , Rfl:  .  clotrimazole-betamethasone (LOTRISONE) cream, Apply 1 application topically 2 (two) times daily as needed for irritation., Disp: , Rfl:  .  ELDERBERRY PO, Take 1 capsule by mouth 2 (two) times daily., Disp: , Rfl:  .  Homeopathic Products [facility] DRY  EYE RELIEF OP), Place 1 drop into both eyes 2 (two) times daily as needed (dry eyes)., Disp: , Rfl:  .  Homeopathic Products [facility] NASAL ALLERGY RELIEF NA), Place 2 sprays into both nostrils daily as needed (allergies)., Disp: , Rfl:  .  lactobacillus acidophilus (BACID) TABS tablet, Take 2 tablets by mouth 3 (three) times daily., Disp: , Rfl:  .  Multiple Minerals (CALCIUM-MAGNESIUM-ZINC) TABS, Take 1 tablet by mouth 3 (three) times daily., Disp: , Rfl:  .  Multiple Vitamin (MULTIVITAMIN WITH  MINERALS) TABS tablet, Take 1 tablet by mouth daily., Disp: , Rfl:  .  ondansetron (ZOFRAN) 4 MG tablet, Take 1 tablet (4 mg total) by mouth every 6 (six) hours as needed for nausea. (Patient not taking: Reported on 05/18/2020), Disp: 20 tablet, Rfl: 0 .  PANCREATIN PO, Take 350 mg by mouth 3 (three) times daily., Disp: , Rfl:  .  Potassium 99 MG TABS, Take 99 mg by mouth 3 (three) times daily., Disp: , Rfl:  .  TURMERIC PO, Take 900 mg by mouth 3 (three) times daily., Disp: , Rfl:   Past Medical Problems: Past Medical History:  Diagnosis Date  . Allergic rhinitis   . Arthritis    right wrist  . Complication of anesthesia   . CRPS (complex regional pain syndrome type I)   . DJD (degenerative joint disease) of knee   . Dysrhythmia     Lt BBB  . Heart murmur    childhood, out grew  . History of iron deficiency anemia    during menstrual cycles  . Hyperlipidemia   . Pneumonia 1998   bacterial  . PONV (postoperative nausea and vomiting)   . Pre-diabetes    history of, has lost weight  . Vitamin D deficiency     Past Surgical History: Past Surgical History:  Procedure Laterality Date  . ABDOMINAL HYSTERECTOMY  1994  . BILATERAL OOPHORECTOMY    . CLOSED REDUCTION TOE FRACTURE Left 1968  . MANDIBLE RECONSTRUCTION  1989  . TONSILLECTOMY  1959  . TOTAL KNEE ARTHROPLASTY Left 01/19/2019   Procedure: Left Total Knee Arthroplasty;  Surgeon: Marcene Corning, MD;  Location: [facility];  Service: Orthopedics;  Laterality: Left;  . TOTAL KNEE ARTHROPLASTY Right 06/15/2019   Procedure: RIGHT TOTAL KNEE ARTHROPLASTY;  Surgeon: Marcene Corning, MD;  Location: [facility];  Service: Orthopedics;  Laterality: Right;  . WRIST SURGERY Right     Social History: Social History   Socioeconomic History  . Marital status: Single    Spouse name: Not on file  . Number of children: Not on file  . Years of education: Not on file  . Highest education level: Not on file  Occupational History  . Not on file   Tobacco Use  . Smoking status: Former Smoker    Packs/day: 0.25    Years: 5.00    Pack years: 1.25    Types: Cigarettes    Quit date: 1984    Years since quitting: 38.1  . Smokeless tobacco: Never Used  Vaping Use  . Vaping Use: Never used  Substance and Sexual Activity  . Alcohol use: Yes    Comment: rare  . Drug use: Never  . Sexual activity: Not on file  Other Topics Concern  . Not on file  Social History Narrative  . Not on file   Social Determinants of Health   Financial Resource Strain: Not on file  Food Insecurity: Not on file  Transportation Needs: Not on file  Physical Activity: Not on file  Stress: Not on file  Social Connections: Not on file  Intimate Partner Violence: Not on file    Family History: No family history on file.  Review of Systems: Review of Systems  Constitutional: Negative.   Respiratory: Negative for shortness of breath.   Cardiovascular: Negative.   Gastrointestinal: Negative.   Musculoskeletal: Negative.   Neurological: Negative.     Physical Exam: Vital Signs There were no vitals taken for this visit. No physical exam was completed, patient  Assessment/Plan: The patient is scheduled for infraumbilical panniculectomy with upper abdomen liposuction and plication of abdominal wall with Dr. Arita Miss.  Risks, benefits, and alternatives of procedure discussed, questions answered and consent obtained.    Smoking Status: Non-smoker; Counseling Given?  N/A Last Mammogram: N/A; Results: N/A  Caprini Score: 6, high; Risk Factors include: Age, BMI greater than 25, varicose veins and length of planned surgery. Recommendation for mechanical and pharmacological prophylaxis for 7 to 10 days. Encourage early ambulation.  Given the patient's overall health status, functionality, do not think that postoperative anticoagulation will be necessary.    Pictures obtained:[email or web address]   Post-op Rx sent to pharmacy: Norco, Zofran  Patient is aware to hold  any over-the-counter supplements 2 weeks prior to surgery which include turmeric and elderberry.  Also recommend she hold off on any multivitamins 1 week prior to surgery.  We also discussed stopping her aspirin 81 mg 1 week prior to surgery.    We discussed the risks associated with abdominoplasty, liposuction. The risks that can be encountered with and after liposuction were discussed and include the following but no limited to these:  Asymmetry, fluid accumulation, firmness of the area, fat necrosis with death of fat tissue, bleeding, infection, delayed healing, anesthesia risks, skin sensation changes, injury to structures including nerves, blood vessels, and muscles which may be temporary or permanent, allergies to tape, suture materials and glues, blood products, topical preparations or injected agents, skin and contour irregularities, skin discoloration and swelling, deep vein thrombosis, cardiac and pulmonary complications, pain, which may persist, persistent pain, recurrence of the lesion, poor healing of the incision, possible need for revisional surgery or staged procedures. Thiere can also be persistent swelling, poor wound healing, rippling or loose skin, worsening of cellulite, swelling, and thermal burn or heat injury from ultrasound with the ultrasound-assisted lipoplasty technique. Any change in weight fluctuations can alter the outcome.  The risk that can be encountered for this procedure were discussed and include the following but not limited to these: asymmetry, fluid accumulation, firmness of the tissue, skin loss, decrease or no sensation, fat necrosis, bleeding, infection, healing delay.  Deep vein thrombosis, cardiac and pulmonary complications are risks to any procedure.  There are risks of anesthesia, changes to skin sensation and injury to nerves or blood vessels.  The muscle can be temporarily or permanently injured.  You may have an allergic reaction to tape, suture, glue, blood  products which can result in skin discoloration, swelling, pain, skin lesions, poor healing.  Any of these can lead to the need for revisonal surgery or stage procedures.  Weight gain and weigh loss can also effect the long term appearance. The results are not guaranteed to last a lifetime.  Future surgery may be required.    All the patient's questions were answered to her content.  I discussed with the patient   Electronically signed by: Leslee Home, PA-C 08/09/2020 1:36 PM

## 2020-08-09 NOTE — Telephone Encounter (Signed)
Completed.

## 2020-08-31 ENCOUNTER — Encounter: Payer: Medicare Other | Admitting: Plastic Surgery

## 2020-09-07 ENCOUNTER — Encounter: Payer: Medicare Other | Admitting: Plastic Surgery

## 2020-09-29 ENCOUNTER — Encounter: Payer: Medicare Other | Admitting: Surgical

## 2020-10-25 ENCOUNTER — Encounter: Payer: Medicare Other | Admitting: Plastic Surgery

## 2020-11-02 ENCOUNTER — Encounter: Payer: Medicare Other | Admitting: Plastic Surgery

## 2021-03-21 IMAGING — DX CHEST - 2 VIEW
2 series · 2 of 2 positions shown · non-contrast
Comparison: Chest x-ray 08/18/2015.

CLINICAL DATA: 68-year-old female under preoperative evaluation
prior to knee replacement.

EXAM:
CHEST - 2 VIEW

[chest pa]
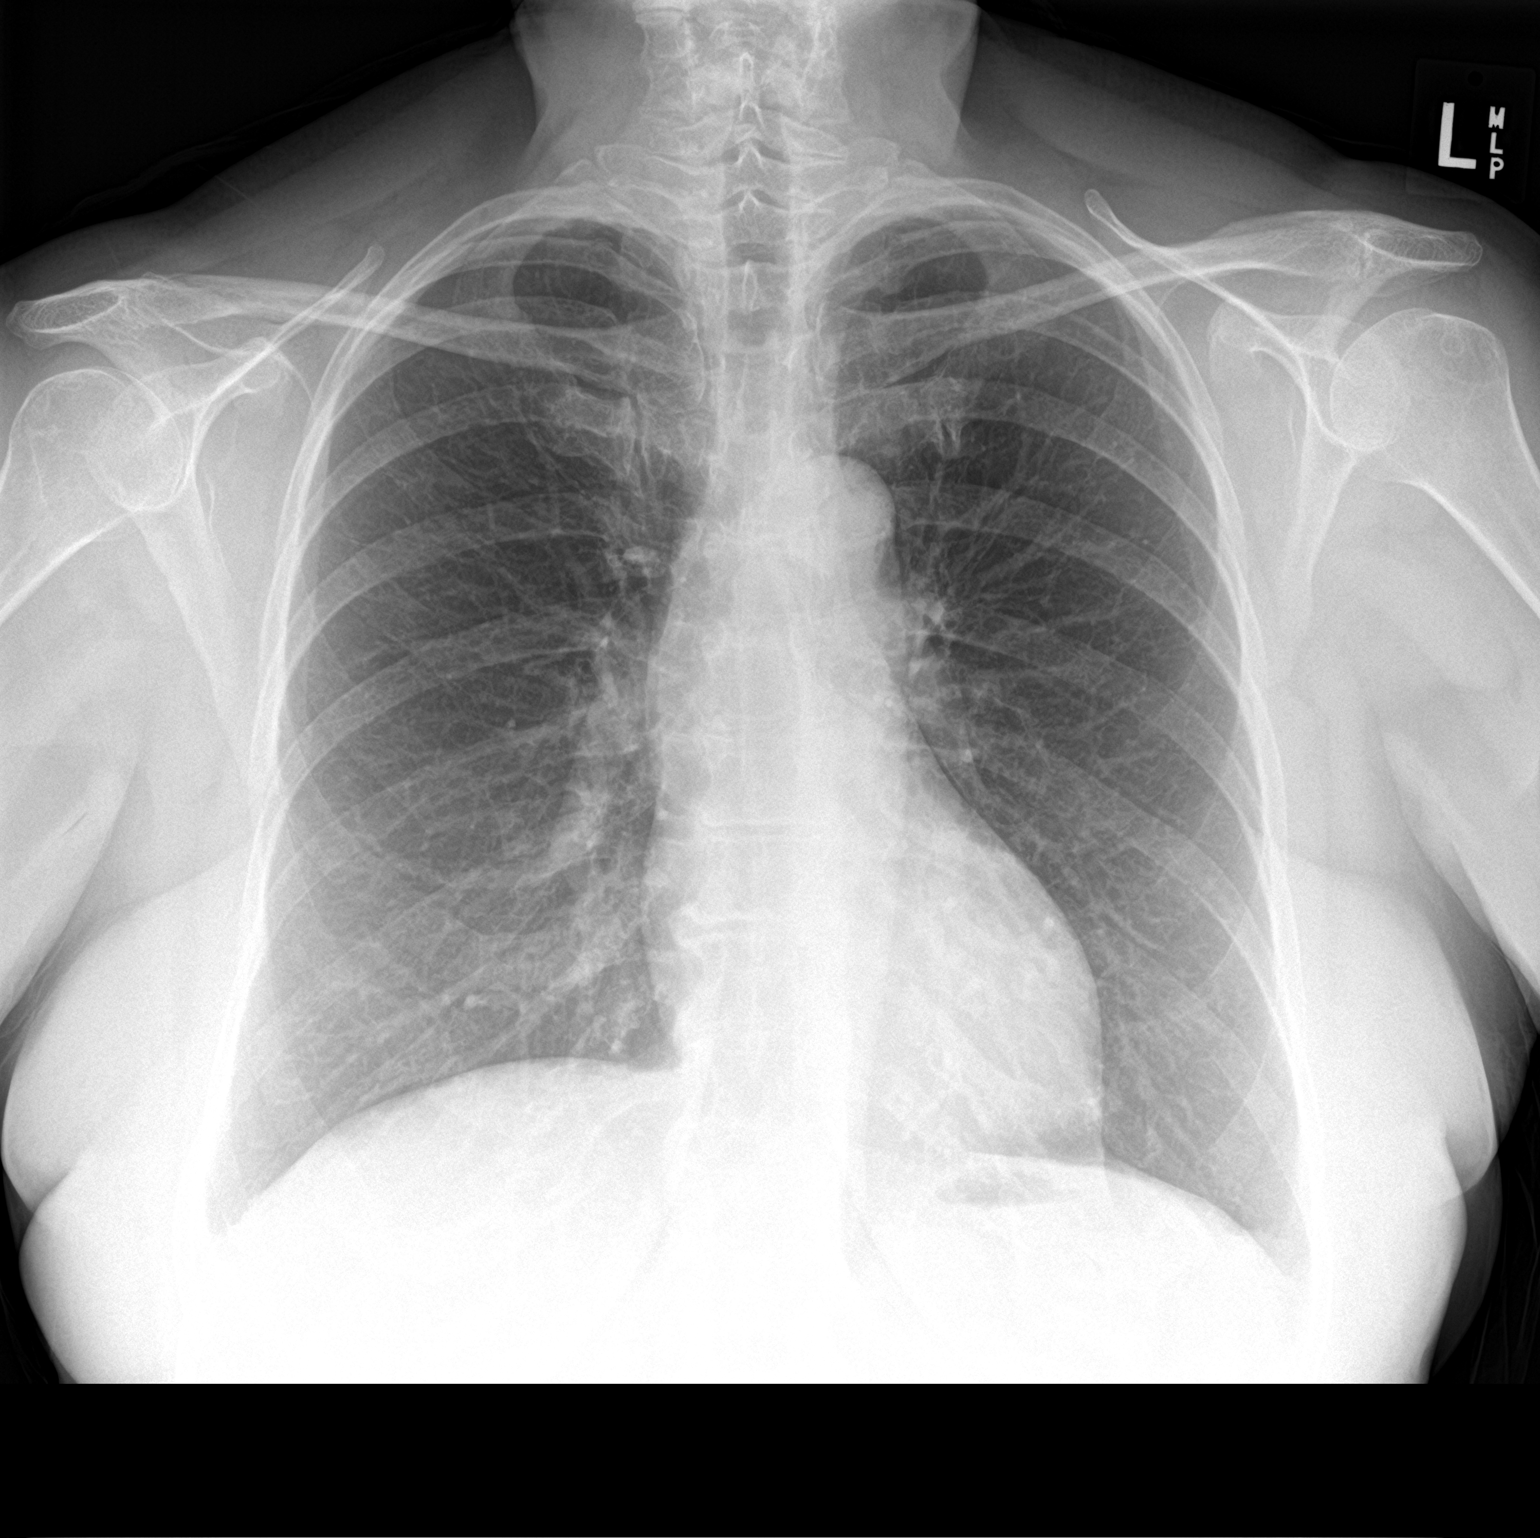

[chest lat]
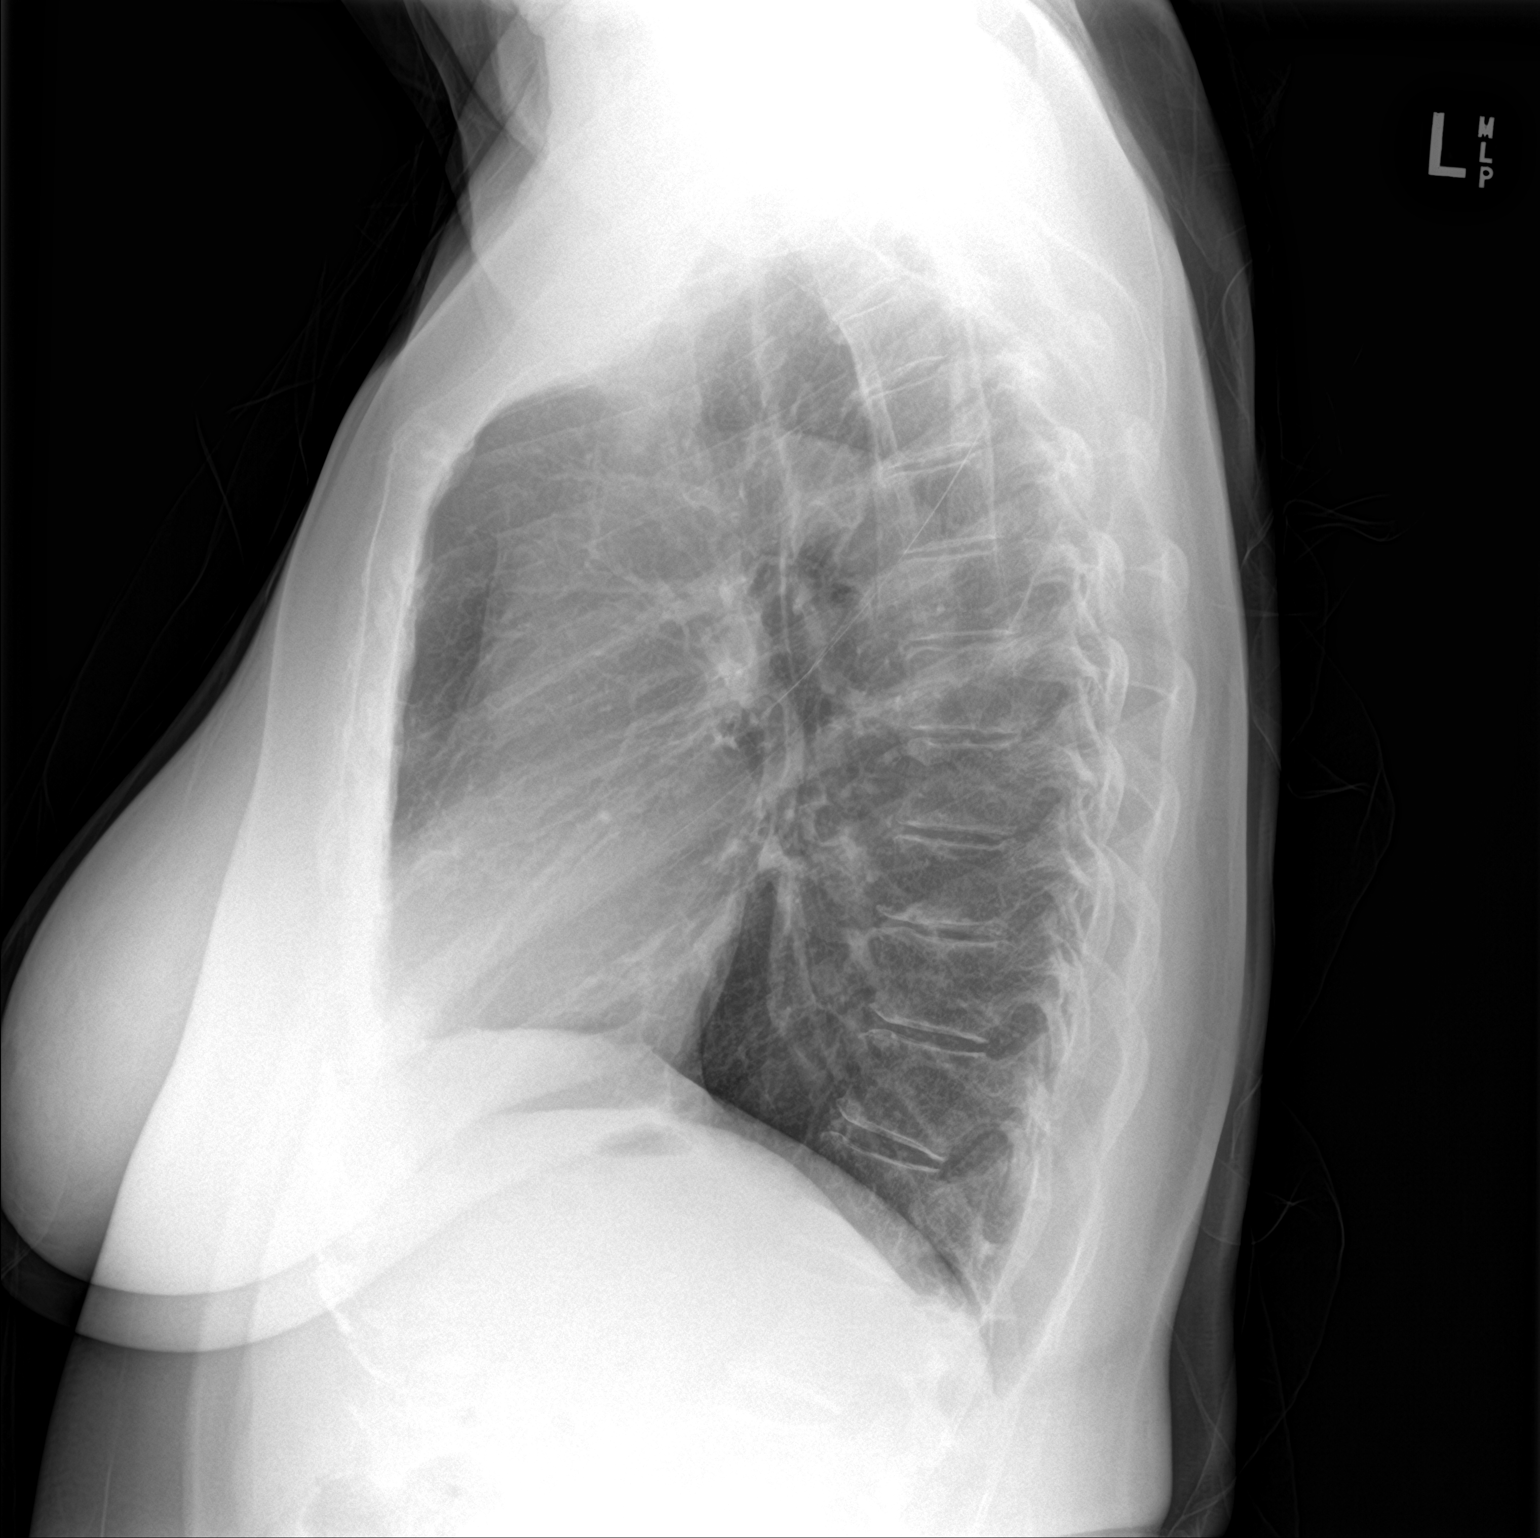

[2 of 2 positions shown; findings below may reference images not displayed]

FINDINGS: Lung volumes are normal. No consolidative airspace disease. No
pleural effusions. No pneumothorax. No pulmonary nodule or mass
noted. Pulmonary vasculature and the cardiomediastinal silhouette
are within normal limits.
IMPRESSION: No radiographic evidence of acute cardiopulmonary disease.
